# Patient Record
Sex: Female | Born: 1955 | Race: White | Hispanic: No | Smoking: Never smoker
Health system: Southern US, Community
[De-identification: ages and names within clinical notes are randomized; demographics above are authoritative.]

## PROBLEM LIST (undated history)

## (undated) DIAGNOSIS — R112 Nausea with vomiting, unspecified: Secondary | ICD-10-CM

## (undated) DIAGNOSIS — E785 Hyperlipidemia, unspecified: Secondary | ICD-10-CM

## (undated) DIAGNOSIS — Z9889 Other specified postprocedural states: Secondary | ICD-10-CM

## (undated) DIAGNOSIS — I517 Cardiomegaly: Secondary | ICD-10-CM

## (undated) DIAGNOSIS — D62 Acute posthemorrhagic anemia: Secondary | ICD-10-CM

## (undated) DIAGNOSIS — M549 Dorsalgia, unspecified: Secondary | ICD-10-CM

## (undated) DIAGNOSIS — F32A Depression, unspecified: Secondary | ICD-10-CM

## (undated) DIAGNOSIS — D509 Iron deficiency anemia, unspecified: Secondary | ICD-10-CM

## (undated) DIAGNOSIS — E669 Obesity, unspecified: Secondary | ICD-10-CM

## (undated) DIAGNOSIS — M1711 Unilateral primary osteoarthritis, right knee: Secondary | ICD-10-CM

## (undated) DIAGNOSIS — K219 Gastro-esophageal reflux disease without esophagitis: Secondary | ICD-10-CM

## (undated) DIAGNOSIS — M1712 Unilateral primary osteoarthritis, left knee: Secondary | ICD-10-CM

## (undated) DIAGNOSIS — Z969 Presence of functional implant, unspecified: Secondary | ICD-10-CM

## (undated) DIAGNOSIS — T4145XA Adverse effect of unspecified anesthetic, initial encounter: Secondary | ICD-10-CM

## (undated) DIAGNOSIS — D649 Anemia, unspecified: Secondary | ICD-10-CM

## (undated) DIAGNOSIS — Z96652 Presence of left artificial knee joint: Secondary | ICD-10-CM

## (undated) DIAGNOSIS — M1990 Unspecified osteoarthritis, unspecified site: Secondary | ICD-10-CM

## (undated) DIAGNOSIS — L02419 Cutaneous abscess of limb, unspecified: Secondary | ICD-10-CM

## (undated) DIAGNOSIS — I219 Acute myocardial infarction, unspecified: Secondary | ICD-10-CM

## (undated) DIAGNOSIS — F329 Major depressive disorder, single episode, unspecified: Secondary | ICD-10-CM

## (undated) DIAGNOSIS — T7840XA Allergy, unspecified, initial encounter: Secondary | ICD-10-CM

## (undated) DIAGNOSIS — F419 Anxiety disorder, unspecified: Secondary | ICD-10-CM

## (undated) DIAGNOSIS — I10 Essential (primary) hypertension: Secondary | ICD-10-CM

## (undated) DIAGNOSIS — D638 Anemia in other chronic diseases classified elsewhere: Secondary | ICD-10-CM

## (undated) DIAGNOSIS — L03119 Cellulitis of unspecified part of limb: Secondary | ICD-10-CM

## (undated) DIAGNOSIS — G5600 Carpal tunnel syndrome, unspecified upper limb: Secondary | ICD-10-CM

## (undated) DIAGNOSIS — T8859XA Other complications of anesthesia, initial encounter: Secondary | ICD-10-CM

## (undated) DIAGNOSIS — N6320 Unspecified lump in the left breast, unspecified quadrant: Secondary | ICD-10-CM

## (undated) DIAGNOSIS — H269 Unspecified cataract: Secondary | ICD-10-CM

## (undated) HISTORY — DX: Cellulitis of unspecified part of limb: L03.119

## (undated) HISTORY — DX: Gastro-esophageal reflux disease without esophagitis: K21.9

## (undated) HISTORY — DX: Iron deficiency anemia, unspecified: D50.9

## (undated) HISTORY — DX: Cardiomegaly: I51.7

## (undated) HISTORY — PX: COLONOSCOPY: SHX174

## (undated) HISTORY — DX: Depression, unspecified: F32.A

## (undated) HISTORY — PX: BREAST LUMPECTOMY: SHX2

## (undated) HISTORY — DX: Carpal tunnel syndrome, unspecified upper limb: G56.00

## (undated) HISTORY — PX: KNEE SURGERY: SHX244

## (undated) HISTORY — PX: FOOT SURGERY: SHX648

## (undated) HISTORY — DX: Major depressive disorder, single episode, unspecified: F32.9

## (undated) HISTORY — DX: Unspecified lump in the left breast, unspecified quadrant: N63.20

## (undated) HISTORY — DX: Unspecified cataract: H26.9

## (undated) HISTORY — PX: ABDOMINAL HYSTERECTOMY: SHX81

## (undated) HISTORY — DX: Acute myocardial infarction, unspecified: I21.9

## (undated) HISTORY — DX: Allergy, unspecified, initial encounter: T78.40XA

## (undated) HISTORY — PX: SPINE SURGERY: SHX786

## (undated) HISTORY — PX: LASIK: SHX215

## (undated) HISTORY — PX: CATARACT EXTRACTION W/ INTRAOCULAR LENS IMPLANT: SHX1309

## (undated) HISTORY — PX: LAPAROSCOPIC NISSEN FUNDOPLICATION: SHX1932

## (undated) HISTORY — DX: Unspecified osteoarthritis, unspecified site: M19.90

## (undated) HISTORY — DX: Cutaneous abscess of limb, unspecified: L02.419

## (undated) HISTORY — PX: GASTRIC BYPASS: SHX52

## (undated) HISTORY — DX: Obesity, unspecified: E66.9

---

## 1999-03-16 ENCOUNTER — Other Ambulatory Visit: Admission: RE | Admit: 1999-03-16 | Discharge: 1999-03-16 | Payer: Self-pay | Admitting: Obstetrics and Gynecology

## 1999-11-02 ENCOUNTER — Encounter: Payer: Self-pay | Admitting: Internal Medicine

## 1999-11-02 ENCOUNTER — Ambulatory Visit (HOSPITAL_COMMUNITY): Admission: RE | Admit: 1999-11-02 | Discharge: 1999-11-02 | Payer: Self-pay | Admitting: Internal Medicine

## 1999-11-08 ENCOUNTER — Encounter: Payer: Self-pay | Admitting: Internal Medicine

## 1999-11-08 ENCOUNTER — Ambulatory Visit (HOSPITAL_COMMUNITY): Admission: RE | Admit: 1999-11-08 | Discharge: 1999-11-08 | Payer: Self-pay | Admitting: Internal Medicine

## 1999-12-06 ENCOUNTER — Encounter: Payer: Self-pay | Admitting: Surgery

## 1999-12-06 ENCOUNTER — Encounter (INDEPENDENT_AMBULATORY_CARE_PROVIDER_SITE_OTHER): Payer: Self-pay | Admitting: Specialist

## 1999-12-06 ENCOUNTER — Ambulatory Visit (HOSPITAL_BASED_OUTPATIENT_CLINIC_OR_DEPARTMENT_OTHER): Admission: RE | Admit: 1999-12-06 | Discharge: 1999-12-06 | Payer: Self-pay | Admitting: Surgery

## 1999-12-25 ENCOUNTER — Other Ambulatory Visit: Admission: RE | Admit: 1999-12-25 | Discharge: 1999-12-25 | Payer: Self-pay | Admitting: Obstetrics and Gynecology

## 2000-02-05 ENCOUNTER — Ambulatory Visit (HOSPITAL_BASED_OUTPATIENT_CLINIC_OR_DEPARTMENT_OTHER): Admission: RE | Admit: 2000-02-05 | Discharge: 2000-02-05 | Payer: Self-pay | Admitting: Orthopedic Surgery

## 2000-06-13 ENCOUNTER — Other Ambulatory Visit: Admission: RE | Admit: 2000-06-13 | Discharge: 2000-06-13 | Payer: Self-pay | Admitting: Obstetrics and Gynecology

## 2000-08-22 ENCOUNTER — Ambulatory Visit (HOSPITAL_COMMUNITY): Admission: RE | Admit: 2000-08-22 | Discharge: 2000-08-22 | Payer: Self-pay | Admitting: Obstetrics and Gynecology

## 2000-08-22 ENCOUNTER — Encounter (INDEPENDENT_AMBULATORY_CARE_PROVIDER_SITE_OTHER): Payer: Self-pay

## 2001-01-06 ENCOUNTER — Other Ambulatory Visit: Admission: RE | Admit: 2001-01-06 | Discharge: 2001-01-06 | Payer: Self-pay | Admitting: Obstetrics and Gynecology

## 2001-06-17 ENCOUNTER — Other Ambulatory Visit: Admission: RE | Admit: 2001-06-17 | Discharge: 2001-06-17 | Payer: Self-pay | Admitting: Obstetrics and Gynecology

## 2001-10-15 HISTORY — PX: ROTATOR CUFF REPAIR: SHX139

## 2002-03-31 ENCOUNTER — Ambulatory Visit (HOSPITAL_COMMUNITY): Admission: RE | Admit: 2002-03-31 | Discharge: 2002-03-31 | Payer: Self-pay | Admitting: Obstetrics and Gynecology

## 2002-03-31 ENCOUNTER — Encounter: Payer: Self-pay | Admitting: Obstetrics and Gynecology

## 2002-04-06 ENCOUNTER — Observation Stay (HOSPITAL_COMMUNITY): Admission: RE | Admit: 2002-04-06 | Discharge: 2002-04-07 | Payer: Self-pay | Admitting: Obstetrics and Gynecology

## 2002-04-06 ENCOUNTER — Encounter (INDEPENDENT_AMBULATORY_CARE_PROVIDER_SITE_OTHER): Payer: Self-pay

## 2002-04-17 ENCOUNTER — Inpatient Hospital Stay (HOSPITAL_COMMUNITY): Admission: AD | Admit: 2002-04-17 | Discharge: 2002-04-17 | Payer: Self-pay | Admitting: Obstetrics and Gynecology

## 2002-09-24 ENCOUNTER — Emergency Department (HOSPITAL_COMMUNITY): Admission: EM | Admit: 2002-09-24 | Discharge: 2002-09-24 | Payer: Self-pay | Admitting: Emergency Medicine

## 2003-05-10 ENCOUNTER — Ambulatory Visit (HOSPITAL_COMMUNITY): Admission: RE | Admit: 2003-05-10 | Discharge: 2003-05-10 | Payer: Self-pay | Admitting: Obstetrics and Gynecology

## 2003-05-10 ENCOUNTER — Encounter: Payer: Self-pay | Admitting: Obstetrics and Gynecology

## 2003-06-17 ENCOUNTER — Other Ambulatory Visit: Admission: RE | Admit: 2003-06-17 | Discharge: 2003-06-17 | Payer: Self-pay | Admitting: Obstetrics and Gynecology

## 2003-12-01 ENCOUNTER — Emergency Department (HOSPITAL_COMMUNITY): Admission: EM | Admit: 2003-12-01 | Discharge: 2003-12-01 | Payer: Self-pay | Admitting: Emergency Medicine

## 2003-12-04 ENCOUNTER — Encounter: Admission: RE | Admit: 2003-12-04 | Discharge: 2003-12-04 | Payer: Self-pay | Admitting: Family Medicine

## 2003-12-16 ENCOUNTER — Encounter: Admission: RE | Admit: 2003-12-16 | Discharge: 2003-12-16 | Payer: Self-pay | Admitting: Family Medicine

## 2003-12-22 ENCOUNTER — Encounter: Admission: RE | Admit: 2003-12-22 | Discharge: 2003-12-22 | Payer: Self-pay | Admitting: Family Medicine

## 2004-01-24 ENCOUNTER — Ambulatory Visit (HOSPITAL_BASED_OUTPATIENT_CLINIC_OR_DEPARTMENT_OTHER): Admission: RE | Admit: 2004-01-24 | Discharge: 2004-01-24 | Payer: Self-pay | Admitting: Orthopedic Surgery

## 2004-01-24 ENCOUNTER — Ambulatory Visit (HOSPITAL_COMMUNITY): Admission: RE | Admit: 2004-01-24 | Discharge: 2004-01-24 | Payer: Self-pay | Admitting: Orthopedic Surgery

## 2005-01-09 ENCOUNTER — Ambulatory Visit (HOSPITAL_COMMUNITY): Admission: RE | Admit: 2005-01-09 | Discharge: 2005-01-09 | Payer: Self-pay | Admitting: Gastroenterology

## 2005-07-30 IMAGING — CR DG KNEE COMPLETE 4+V*R*
4 series · 4 of 4 positions shown · non-contrast
Comparison: none

CLINICAL DATA: Posterior knee pain.  

 FOUR VIEW RIGHT KNEE
 Four views demonstrate no acute bony abnormality with no effusion.  There is an old fractured screw in the proximal tibia at the level of the tibial tuberosity.  
 IMPRESSION
 No acute abnormality with no effusion. 
 Old fractured tibial tuberosity screw. 
 [REDACTED]

[view not recorded (1 of 4)]
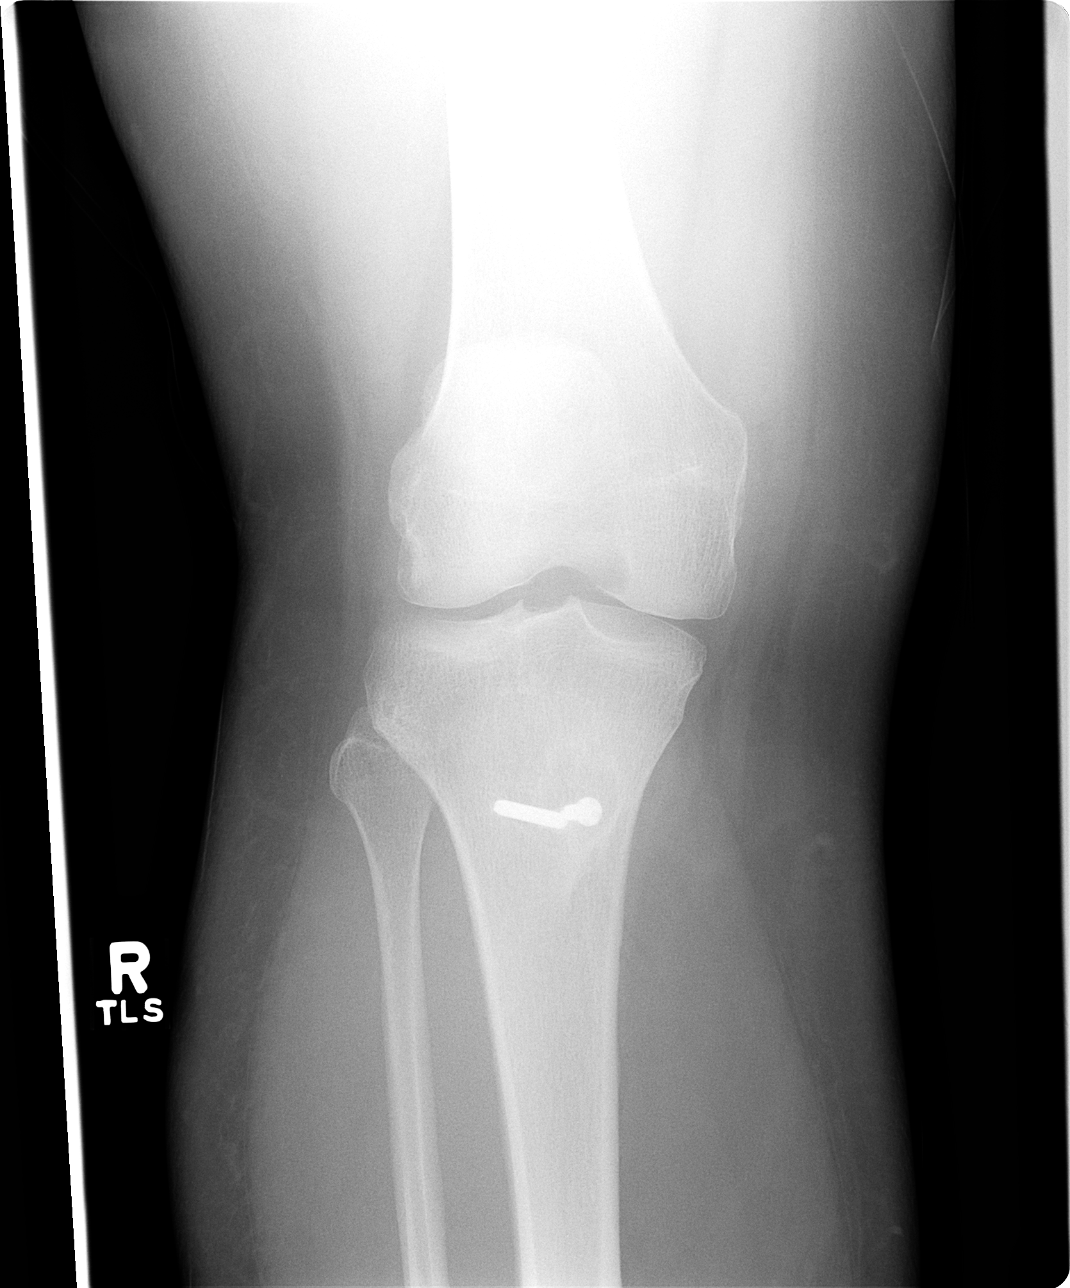

[view not recorded (2 of 4)]
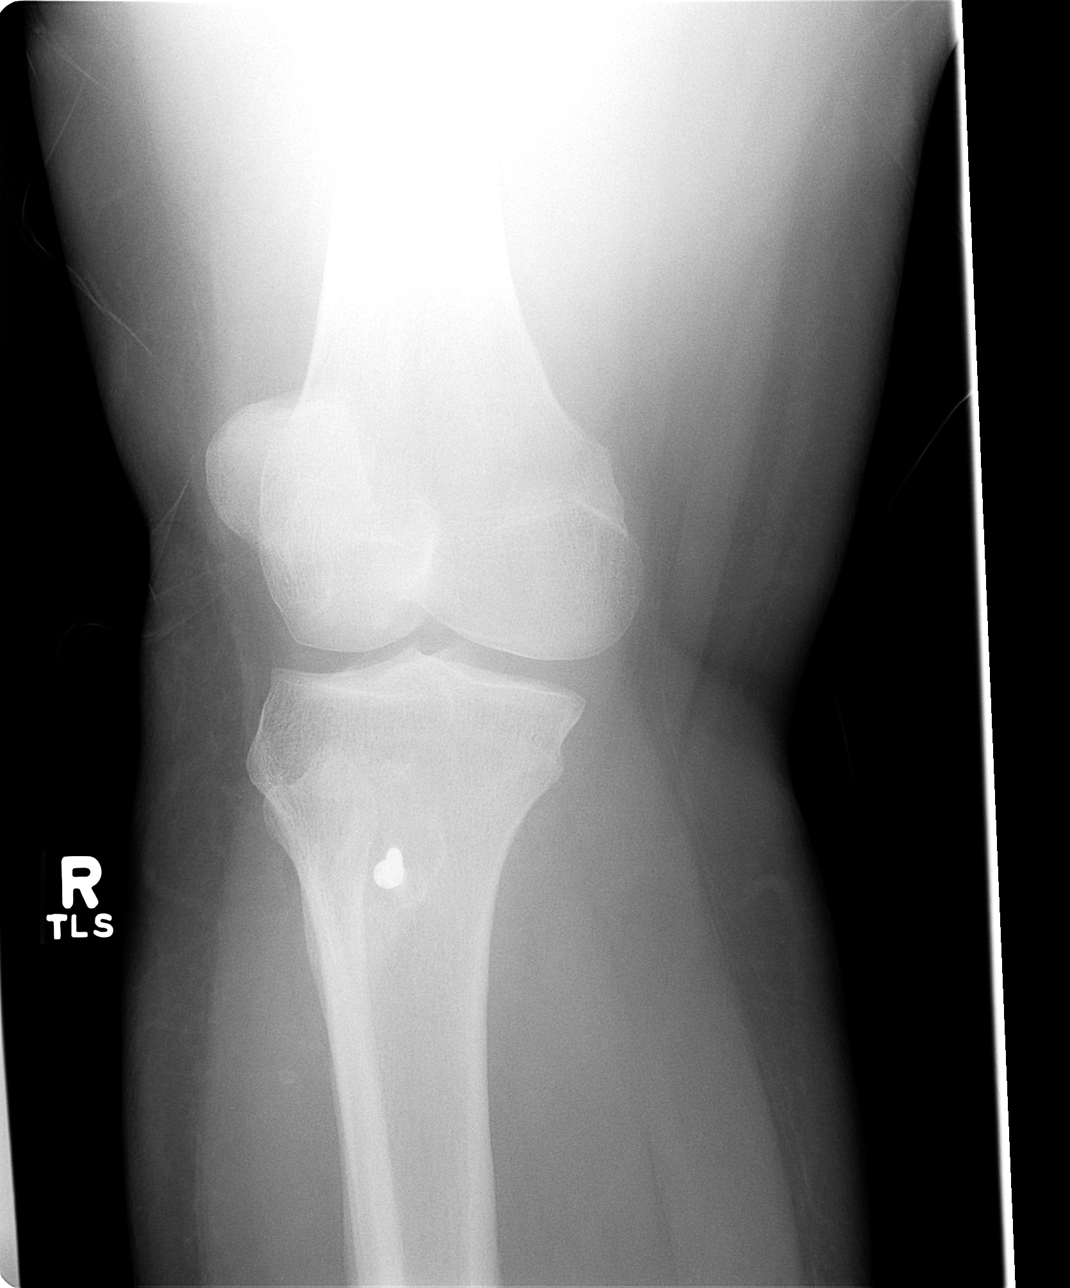

[view not recorded (3 of 4)]
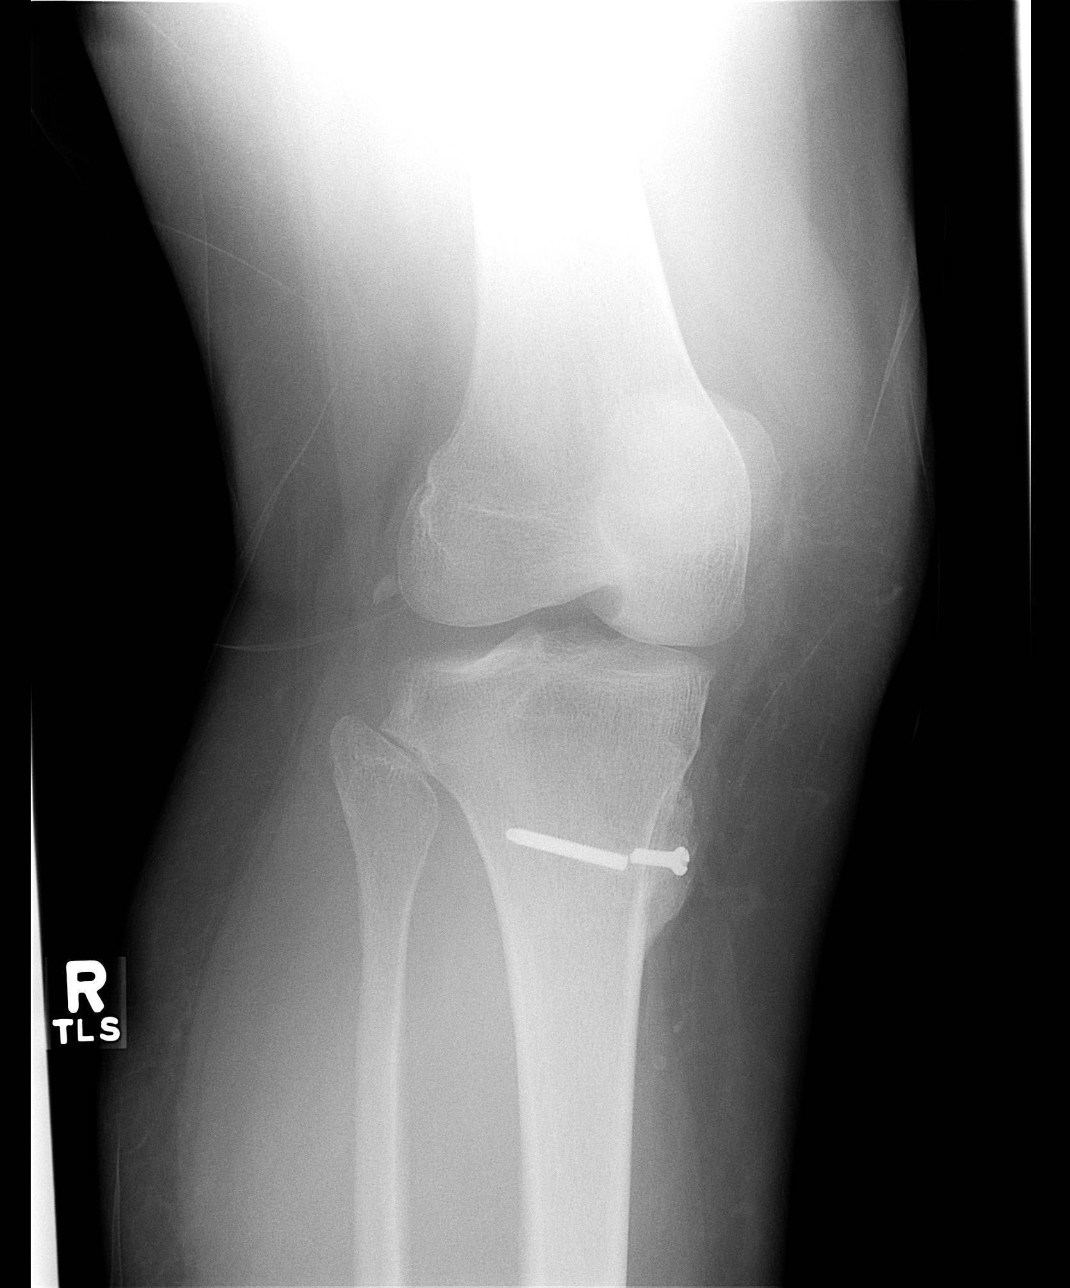

[view not recorded (4 of 4)]
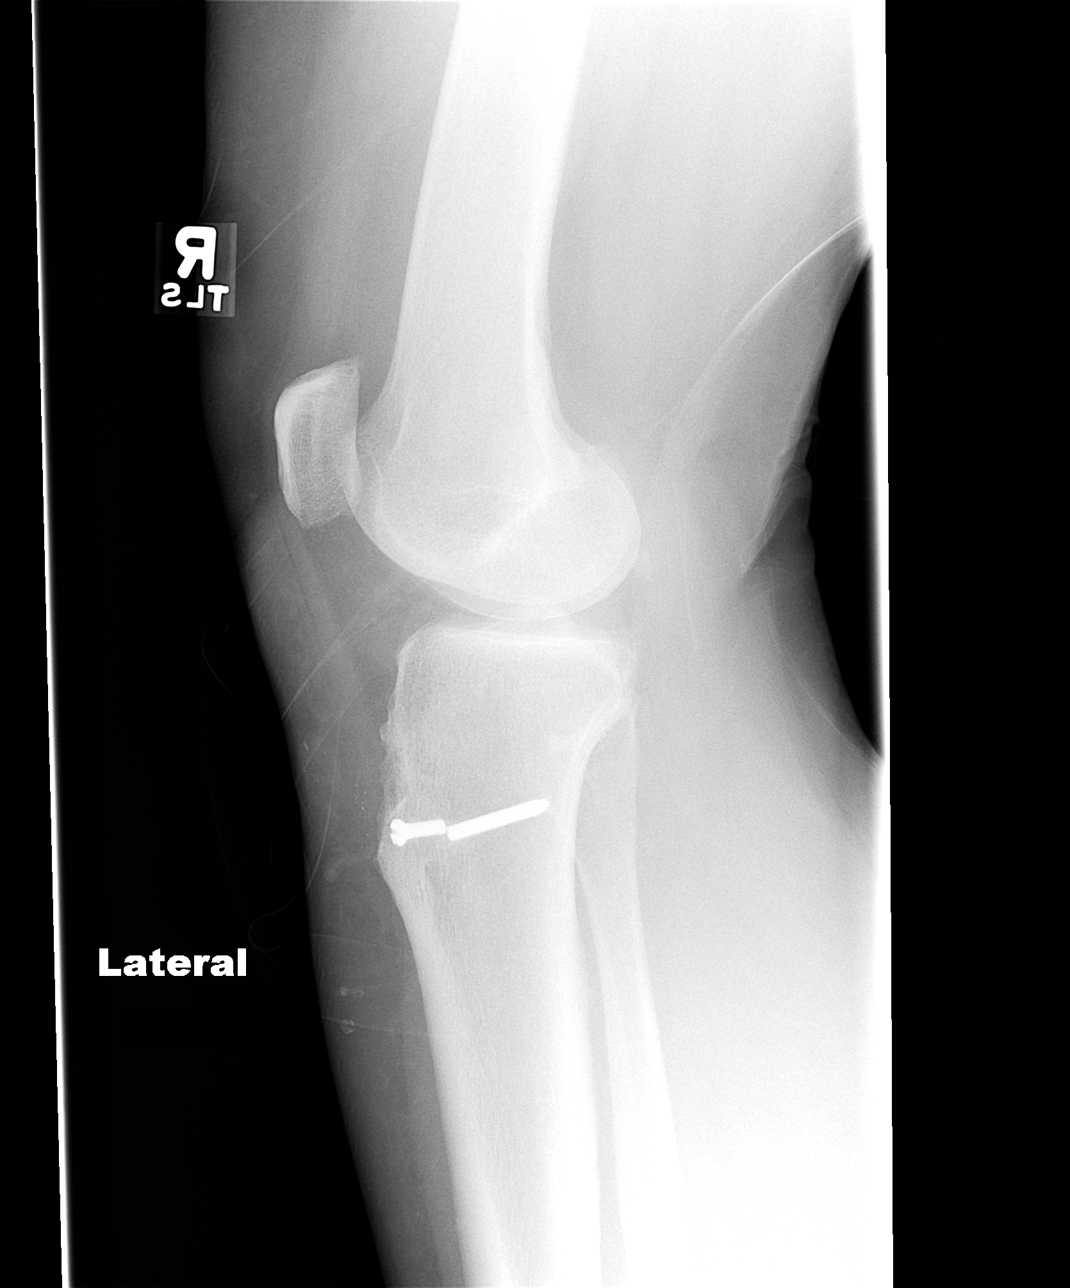

[4 of 4 positions shown; findings below may reference images not displayed]

## 2005-10-30 ENCOUNTER — Ambulatory Visit (HOSPITAL_COMMUNITY): Admission: RE | Admit: 2005-10-30 | Discharge: 2005-10-30 | Payer: Self-pay | Admitting: Surgery

## 2005-12-19 ENCOUNTER — Inpatient Hospital Stay (HOSPITAL_COMMUNITY): Admission: RE | Admit: 2005-12-19 | Discharge: 2005-12-23 | Payer: Self-pay | Admitting: Surgery

## 2006-06-06 ENCOUNTER — Ambulatory Visit (HOSPITAL_COMMUNITY): Admission: RE | Admit: 2006-06-06 | Discharge: 2006-06-06 | Payer: Self-pay | Admitting: Internal Medicine

## 2008-07-06 ENCOUNTER — Ambulatory Visit (HOSPITAL_COMMUNITY): Admission: RE | Admit: 2008-07-06 | Discharge: 2008-07-06 | Payer: Self-pay | Admitting: Surgery

## 2008-07-07 ENCOUNTER — Ambulatory Visit (HOSPITAL_COMMUNITY): Admission: RE | Admit: 2008-07-07 | Discharge: 2008-07-07 | Payer: Self-pay | Admitting: Surgery

## 2008-07-08 ENCOUNTER — Encounter: Admission: RE | Admit: 2008-07-08 | Discharge: 2008-07-08 | Payer: Self-pay | Admitting: Surgery

## 2008-12-23 ENCOUNTER — Encounter: Admission: RE | Admit: 2008-12-23 | Discharge: 2009-03-23 | Payer: Self-pay | Admitting: Surgery

## 2009-01-11 ENCOUNTER — Inpatient Hospital Stay (HOSPITAL_COMMUNITY): Admission: RE | Admit: 2009-01-11 | Discharge: 2009-01-14 | Payer: Self-pay | Admitting: Surgery

## 2009-01-12 ENCOUNTER — Ambulatory Visit: Payer: Self-pay | Admitting: Vascular Surgery

## 2009-01-12 ENCOUNTER — Encounter (INDEPENDENT_AMBULATORY_CARE_PROVIDER_SITE_OTHER): Payer: Self-pay | Admitting: Surgery

## 2009-04-04 ENCOUNTER — Encounter: Admission: RE | Admit: 2009-04-04 | Discharge: 2009-04-04 | Payer: Self-pay | Admitting: Surgery

## 2009-07-04 ENCOUNTER — Encounter: Admission: RE | Admit: 2009-07-04 | Discharge: 2009-07-04 | Payer: Self-pay | Admitting: Surgery

## 2010-11-06 ENCOUNTER — Encounter: Payer: Self-pay | Admitting: Surgery

## 2010-11-15 DIAGNOSIS — L02419 Cutaneous abscess of limb, unspecified: Secondary | ICD-10-CM

## 2010-11-15 HISTORY — DX: Cutaneous abscess of limb, unspecified: L02.419

## 2010-11-20 ENCOUNTER — Emergency Department (HOSPITAL_COMMUNITY): Payer: BC Managed Care – PPO

## 2010-11-20 ENCOUNTER — Inpatient Hospital Stay (HOSPITAL_COMMUNITY)
Admission: EM | Admit: 2010-11-20 | Discharge: 2010-11-26 | DRG: 277 | Disposition: A | Discharge status: Home Health | Payer: BC Managed Care – PPO | Attending: Internal Medicine | Admitting: Internal Medicine

## 2010-11-20 DIAGNOSIS — I10 Essential (primary) hypertension: Secondary | ICD-10-CM | POA: Diagnosis present

## 2010-11-20 DIAGNOSIS — F3289 Other specified depressive episodes: Secondary | ICD-10-CM | POA: Diagnosis present

## 2010-11-20 DIAGNOSIS — B950 Streptococcus, group A, as the cause of diseases classified elsewhere: Secondary | ICD-10-CM | POA: Diagnosis present

## 2010-11-20 DIAGNOSIS — L02419 Cutaneous abscess of limb, unspecified: Principal | ICD-10-CM | POA: Diagnosis present

## 2010-11-20 DIAGNOSIS — L03119 Cellulitis of unspecified part of limb: Principal | ICD-10-CM | POA: Diagnosis present

## 2010-11-20 DIAGNOSIS — D509 Iron deficiency anemia, unspecified: Secondary | ICD-10-CM | POA: Diagnosis present

## 2010-11-20 DIAGNOSIS — E669 Obesity, unspecified: Secondary | ICD-10-CM | POA: Diagnosis present

## 2010-11-20 DIAGNOSIS — I872 Venous insufficiency (chronic) (peripheral): Secondary | ICD-10-CM | POA: Diagnosis present

## 2010-11-20 DIAGNOSIS — E876 Hypokalemia: Secondary | ICD-10-CM | POA: Diagnosis present

## 2010-11-20 DIAGNOSIS — Z9884 Bariatric surgery status: Secondary | ICD-10-CM

## 2010-11-20 DIAGNOSIS — F988 Other specified behavioral and emotional disorders with onset usually occurring in childhood and adolescence: Secondary | ICD-10-CM | POA: Diagnosis present

## 2010-11-20 DIAGNOSIS — F329 Major depressive disorder, single episode, unspecified: Secondary | ICD-10-CM | POA: Diagnosis present

## 2010-11-20 DIAGNOSIS — K219 Gastro-esophageal reflux disease without esophagitis: Secondary | ICD-10-CM | POA: Diagnosis present

## 2010-11-20 LAB — PROTIME-INR
INR: 1.24 (ref 0.00–1.49)
Prothrombin Time: 15.8 seconds — ABNORMAL HIGH (ref 11.6–15.2)

## 2010-11-20 LAB — COMPREHENSIVE METABOLIC PANEL
Albumin: 3.3 g/dL — ABNORMAL LOW (ref 3.5–5.2)
Alkaline Phosphatase: 55 U/L (ref 39–117)
BUN: 11 mg/dL (ref 6–23)
Calcium: 8.2 mg/dL — ABNORMAL LOW (ref 8.4–10.5)
Creatinine, Ser: 0.84 mg/dL (ref 0.4–1.2)
Glucose, Bld: 97 mg/dL (ref 70–99)
Total Protein: 7 g/dL (ref 6.0–8.3)

## 2010-11-20 LAB — LACTIC ACID, PLASMA: Lactic Acid, Venous: 1.4 mmol/L (ref 0.5–2.2)

## 2010-11-20 LAB — CBC
MCHC: 34.4 g/dL (ref 30.0–36.0)
Platelets: 236 10*3/uL (ref 150–400)
RDW: 14.1 % (ref 11.5–15.5)
WBC: 12.7 10*3/uL — ABNORMAL HIGH (ref 4.0–10.5)

## 2010-11-20 LAB — DIFFERENTIAL
Basophils Absolute: 0 10*3/uL (ref 0.0–0.1)
Basophils Relative: 0 % (ref 0–1)
Eosinophils Relative: 0 % (ref 0–5)
Lymphocytes Relative: 8 % — ABNORMAL LOW (ref 12–46)
Monocytes Absolute: 1.1 10*3/uL — ABNORMAL HIGH (ref 0.1–1.0)

## 2010-11-21 LAB — TSH: TSH: 1.398 u[IU]/mL (ref 0.350–4.500)

## 2010-11-21 LAB — URINE MICROSCOPIC-ADD ON

## 2010-11-21 LAB — URINALYSIS, ROUTINE W REFLEX MICROSCOPIC
Protein, ur: NEGATIVE mg/dL
Specific Gravity, Urine: 1.018 (ref 1.005–1.030)
Urine Glucose, Fasting: NEGATIVE mg/dL

## 2010-11-22 LAB — BASIC METABOLIC PANEL
CO2: 26 mEq/L (ref 19–32)
Calcium: 8.1 mg/dL — ABNORMAL LOW (ref 8.4–10.5)
Creatinine, Ser: 0.74 mg/dL (ref 0.4–1.2)
Glucose, Bld: 108 mg/dL — ABNORMAL HIGH (ref 70–99)

## 2010-11-22 LAB — CBC
HCT: 27.5 % — ABNORMAL LOW (ref 36.0–46.0)
Hemoglobin: 9.3 g/dL — ABNORMAL LOW (ref 12.0–15.0)
MCH: 29.2 pg (ref 26.0–34.0)
MCHC: 33.8 g/dL (ref 30.0–36.0)

## 2010-11-23 ENCOUNTER — Inpatient Hospital Stay (HOSPITAL_COMMUNITY): Payer: BC Managed Care – PPO

## 2010-11-23 DIAGNOSIS — L0291 Cutaneous abscess, unspecified: Secondary | ICD-10-CM

## 2010-11-23 DIAGNOSIS — L0390 Cellulitis, unspecified: Secondary | ICD-10-CM

## 2010-11-23 LAB — BASIC METABOLIC PANEL
BUN: 4 mg/dL — ABNORMAL LOW (ref 6–23)
Calcium: 8.3 mg/dL — ABNORMAL LOW (ref 8.4–10.5)
GFR calc non Af Amer: >60 mL/min (ref >60)
Glucose, Bld: 109 mg/dL — ABNORMAL HIGH (ref 70–99)
Sodium: 142 mEq/L (ref 135–145)

## 2010-11-23 LAB — CBC
MCH: 28.6 pg (ref 26.0–34.0)
MCHC: 33.1 g/dL (ref 30.0–36.0)
Platelets: 189 10*3/uL (ref 150–400)
RDW: 14.1 % (ref 11.5–15.5)

## 2010-11-23 MED ORDER — GADOXETATE DISODIUM 0.25 MMOL/ML IV SOLN: 20.0000 mL | Freq: Once | INTRAVENOUS | Status: AC | PRN

## 2010-11-24 LAB — BASIC METABOLIC PANEL
Calcium: 8.5 mg/dL (ref 8.4–10.5)
Creatinine, Ser: 0.75 mg/dL (ref 0.4–1.2)
GFR calc Af Amer: >60 mL/min (ref >60)

## 2010-11-24 LAB — CBC
MCH: 28.6 pg (ref 26.0–34.0)
MCHC: 33.1 g/dL (ref 30.0–36.0)
Platelets: 193 10*3/uL (ref 150–400)
RDW: 14.2 % (ref 11.5–15.5)

## 2010-11-24 LAB — IRON AND TIBC
Iron: 25 ug/dL — ABNORMAL LOW (ref 42–135)
TIBC: 226 ug/dL — ABNORMAL LOW (ref 250–470)

## 2010-11-26 LAB — CULTURE, BLOOD (ROUTINE X 2)
Culture  Setup Time: 201202062253
Culture  Setup Time: 201202062253
Culture: NO GROWTH

## 2010-11-26 LAB — CBC
MCH: 28.6 pg (ref 26.0–34.0)
MCV: 86.8 fL (ref 78.0–100.0)
Platelets: 269 10*3/uL (ref 150–400)
RDW: 14.2 % (ref 11.5–15.5)

## 2010-11-26 LAB — BASIC METABOLIC PANEL
BUN: 5 mg/dL — ABNORMAL LOW (ref 6–23)
Chloride: 108 mEq/L (ref 96–112)
Creatinine, Ser: 0.75 mg/dL (ref 0.4–1.2)
GFR calc non Af Amer: >60 mL/min (ref >60)
Glucose, Bld: 99 mg/dL (ref 70–99)

## 2010-11-27 NOTE — H&P (Signed)
Nicole [name], [name]              ACCOUNT NO.:  [id number]  MEDICAL RECORD NO.:  [id number]           PATIENT TYPE:  E  LOCATION:  WLED                         FACILITY:  [facility]  PHYSICIAN:  Geoffry Paradise, M.D.  DATE OF BIRTH:  [birth year]  DATE OF ADMISSION:  11/20/2010 DATE OF DISCHARGE:                             HISTORY & PHYSICAL   CHIEF COMPLAINT:  Fever and leg rash.  HISTORY OF PRESENT ILLNESS:  Nicole [name] is a 55 year old with past medical history including recent bariatric surgery, gastroesophageal reflux disease, hypertension presenting at this time with 24-48 hours of febrile illness and rash involving her right lower extremity.  She is normally very active and on her feet through the week, teaching full time.  Sometimes Saturday, she began feeling feverish and noticed some malaise and weakness.  As of yesterday, she noted a rash appearing on her right lower extremity and ultimately contacted the office requesting Tamiflu this morning because of "flu."  Ultimately, she presented to urgent medical with persistent fevers and development of nausea, vomiting and some loose stools but was ultimately sent to the emergency room when she was noted to have a systolic pressure in the 90s and what appeared be a fairly extensive right lower extremity cellulitis.  At this point, she does relate vomiting and pain involving this right lower extremity and the spreading rash.  She has felt feverish, does relate some nausea and intermittent vomiting with some loose stool.  She is having no cough or congestion.  She denies any abdominal pain.  She denies any urinary symptoms.  She is having no headaches, photophobia dizziness or lightheadedness.  She is admitted at this time for IV fluids and IV antibiotics.  At this point, she has already received one dose of Zosyn.  PAST MEDICAL HISTORY:  The patient is INTOLERANT TO CODEINE and CONTRAST.  MEDICATIONS: 1. Citalopram 40 mg  daily. 2. Flonase 2 sprays each nostril daily. 3. Wellbutrin XL 150 one daily. 4. Zyrtec 10 mg p.o. daily as needed. 5. Benicar 40 mg daily. 6. Multivitamin one daily. 7. B12 tablets one every other day. 8. Calcium 600 mg 2 daily. 9. Mobic 15 mg once daily.  MEDICAL ILLNESS: 1. Gastroesophageal reflux disease. 2. Venous insufficiency. 3. Hyperlipidemia. 4. Hypertension. 5. Chronic obesity. 6. Osteoarthritis. 7. Carpal tunnel syndrome.  PAST SURGICAL ILLNESSES: 1. Multiple back surgeries, 1987, 1989. 2. Multiple knee surgeries, right 1979, left 1998, 1999. 3. Right shoulder reconstruction, April 2001. 4. Total abdominal hysterectomy, June 2003. 5. Reflux surgery by Dr. Daphine Deutscher in 2007. 6. Recent gastric bypass surgery, March 2010 and excision of a benign     left breast mass.  SOCIAL HISTORY:  The patient is divorced, has 2 children.  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  Does not smoke or drink.  FAMILY HISTORY:  Remarkable for coronary disease, hyperlipidemia, hypertension. PHYSICAL EXAM:  VITAL SIGNS:  Temperature is 99, blood pressure 119/77, pulse 80, respiratory rate is 20, O2 saturation is 97%. GENERAL:  The patient is awake, alert, supine, appears ill. HEENT:  Good facial symmetry.  Anicteric.  Oropharynx  benign. NECK:  No JVD or bruits.  Supple, nontender.  Carotids 2+/4+. LUNGS:  Clear to auscultation and percussion. CARDIOVASCULAR:  Regular rate and rhythm.  No murmur, gallop, rub or heave. ABDOMEN:  Soft, nontender.  Bowel sounds normal.  No rebound or guarding. BACK:  Without CVA or spinal tenderness. EXTREMITIES:  Revealed intact distal pulses.  Joints appear without effusions.  Left lower extremity is normal.  Right lower extremity is trace to 1+ edema with some venous changes.  She does have a fairly significant spreading erythema, warmth and tenderness involving the pretibial and calf regions compatible with a  cellulitis.  Denna Haggard' sign is negative.  Pulses are intact. NEUROLOGICALLY:  She is nonlateralizing.  She is awake, alert, higher cortical functioning is intact.  She is mentating, moves extremities x4.  LABORATORY DATA:  CBC, hemoglobin is 11.2, hematocrit 32.6, white blood cell count is 12.7, platelet count is 236,000.  Lactate is 1.4.  PT 1.24.  Chemistries are pending at this time.  Blood cultures have been done as well.  ASSESSMENT: 1. Right lower extremity cellulitis with evidence of toxicity.  No     evidence of vascular compromise or deep venous thrombosis. 2. Gastroesophageal reflux disease. 3. Essential hypertension. 4. History of morbid obesity following gastric bypass surgery,     successful in 2010.  PLAN:  The patient will be admitted.  She has been pancultured.  She will be hydrated, treat empirically with antibiotics.  Her Benicar will be held.  Given marginal systolic pressures at this time pending her response to treatment.  For further see orders.          ______________________________ Geoffry Paradise, M.D.     RA/MEDQ  D:  11/20/2010  T:  11/20/2010  Job:  [id number]  Electronically Signed by Geoffry Paradise M.D. on 11/27/2010 09:21:45 PM

## 2010-12-18 NOTE — Discharge Summary (Signed)
Nicole [name], Nicole [name]              ACCOUNT NO.:  [id number]  MEDICAL RECORD NO.:  [id number]           PATIENT TYPE:  I  LOCATION:  1406                         FACILITY:  [facility]  PHYSICIAN:  Gwen Pounds, MD       DATE OF BIRTH:  Dec 10, 1955  DATE OF ADMISSION:  11/20/2010 DATE OF DISCHARGE:                              DISCHARGE SUMMARY   DISCHARGE DIAGNOSES:  Include: 1. Right lower extremity cellulitis, presumed streptococcal     cellulitis. 2. Significant iron-deficiency anemia. 3. Hypokalemia. 4. Hypertension, not needing Benicar currently. 5. Adult attention-deficit disorder. 6. Obesity. 7. Seasonal allergic rhinitis. 8. Degenerative joint disease. 9. Depression. 10.Venous insufficiency. 11.Gastroesophageal reflux disease. 12.Mixed hyperlipidemia. 13.Cardiomegaly on x-ray with history of normal echocardiogram. 14.Carpal tunnel syndrome. 15.History of benign fibroadenoma 16.History of back surgeries 17.History of knee surgeries 18.Right shoulder reconstruction 19.Total abdominal hysterectomy. 20.Reflux with question hiatal hernia surgery per Dr. Daphine Deutscher in 2007. 21.Bariatric surgery, March 2010.  DISCHARGE MEDICATIONS:  List includes: 1. Citalopram 40 mg p.o. daily. 2. Wellbutrin XL 150 mg p.o. daily. 3. Benicar 40 mg, on hold until blood pressure elevates. 4. Meloxicam 15 mg p.o. daily. 5. Tylenol 325 mg 1-2 tablets every 6 hours as needed for pain or     fever. 6. Flonase 2 sprays each nostril daily. 7. Zyrtec 10 mg p.o. daily as needed. 8. Multivitamin 1 p.o. daily. 9. B12 tablets 1 every other day. 10.Calcium 600 mg 2 tablets daily. 11.Keflex 500 mg p.o. q.i.d., #20 written.  DISCHARGE PROCEDURES:  Include: 1. IV iron infusion which caused significant pain. 2. MRI of the lower extremity revealing superficial cellulitis of the     right lower leg.  No myositis, osteomyelitis or abscess noted. 3. X-ray shows low lung volumes, otherwise negative. 4. Medical  management. 5. Consultation with Infectious Disease.  HISTORY OF PRESENT ILLNESS:  Briefly, Ms. Nicole [name] is a 55 year old female with obesity and venous insufficiency who presented to medical attention on November 20, 2010, with 1 to 2 days of febrile illness and progressive rash in the right lower extremity.  Other symptoms included malaise and weakness.  She thought she had a flu.  She was seen and admitted on November 20, 2010, and immediately placed on IV fluids and IV antibiotics including Zosyn for a right lower extremity cellulitis.  The leg was red, hot, swollen and blistering.  White count was 12.7.  HOSPITAL COURSE:  Ms. Nicole [name] was admitted with a right lower extremity cellulitis.  No evidence of toxicity.  The blisters were only forming in the area of the right lower extremity.  She was pancultured. All cultures remained negative.  Infectious Disease was brought on board and felt that this possibly could be a group A beta-hemolytic strep. They recommended placing continuing on vancomycin and changing Zosyn to clindamycin which was done.  MRI was performed and did not reveal a deeper infection.  She finished 5 days of vancomycin and clindamycin and the IV medications were changed over to oral Keflex.  She improved nicely on the antibiotics.  She is currently afebrile.  Her white count is back to normal.  She is up walking.  She is eating.  She is tolerating medical treatment and she will be ready for medical discharge on November 26, 2010, to finish 5 more days of oral antibiotics.  She will follow up with Dr. Felipa Eth in the office later this week.  She has minimal redness and blistering and looks much better and no current complaints.  All her vital signs are stable at this current time and with the right lower extremity cellulitis, again discharged on Keflex with white count of 8.5.  Other issue throughout the hospital stay was anemia.  She had iron deficiency anemia.   She did get iron.  It caused significant neck, throat, back pain.  No hives or rash.  Her hemoglobin got as low as 8.9 and on date of discharge it was 9.1.  Her hemoglobin and other lab parameters resolved throughout as an outpatient.  She did have some hypokalemia which was repleted and she is doing markedly better with that at this time.  Her Benicar is currently on hold as her blood pressures is on the low end and this can be restarted as an outpatient.  On date of discharge, sodium 142, potassium 3.8, chloride 108, bicarb 26, BUN 5, creatinine 0.75, glucose 99.  White count 8.5, hemoglobin 9.1 and platelet count 269.  All of her iron profile parameters are low with a total iron of 25, total iron binding capacity 226 and percent sat of only 11.  All blood cultures remained negative.  TSH was fine and lactic acid was 1.4 on admission.  She will get her vancomycin today and will go home in stable condition.     Gwen Pounds, MD     JMR/MEDQ  D:  11/26/2010  T:  11/26/2010  Job:  [id number]  Electronically Signed by Creola Corn MD on 12/18/2010 09:10:49 PM

## 2011-01-24 LAB — DIFFERENTIAL
Basophils Absolute: 0 10*3/uL (ref 0.0–0.1)
Basophils Relative: 0 % (ref 0–1)
Eosinophils Absolute: 0.1 10*3/uL (ref 0.0–0.7)
Lymphs Abs: 2 10*3/uL (ref 0.7–4.0)
Neutrophils Relative %: 66 % (ref 43–77)

## 2011-01-24 LAB — CBC
MCV: 87.3 fL (ref 78.0–100.0)
MCV: 88.1 fL (ref 78.0–100.0)
Platelets: 136 10*3/uL — ABNORMAL LOW (ref 150–400)
RBC: 3.72 MIL/uL — ABNORMAL LOW (ref 3.87–5.11)
RDW: 14.3 % (ref 11.5–15.5)
WBC: 7.8 10*3/uL (ref 4.0–10.5)
WBC: 8.5 10*3/uL (ref 4.0–10.5)

## 2011-01-25 LAB — CBC
HCT: 38.1 % (ref 36.0–46.0)
Hemoglobin: 12.8 g/dL (ref 12.0–15.0)
MCHC: 33.2 g/dL (ref 30.0–36.0)
MCV: 87 fL (ref 78.0–100.0)
MCV: 88.5 fL (ref 78.0–100.0)
Platelets: 172 10*3/uL (ref 150–400)
RBC: 3.69 MIL/uL — ABNORMAL LOW (ref 3.87–5.11)
RBC: 4.39 MIL/uL (ref 3.87–5.11)
WBC: 6.3 10*3/uL (ref 4.0–10.5)
WBC: 9.1 10*3/uL (ref 4.0–10.5)

## 2011-01-25 LAB — DIFFERENTIAL
Basophils Absolute: 0.1 10*3/uL (ref 0.0–0.1)
Basophils Relative: 0 % (ref 0–1)
Basophils Relative: 1 % (ref 0–1)
Eosinophils Absolute: 0 10*3/uL (ref 0.0–0.7)
Lymphocytes Relative: 34 % (ref 12–46)
Lymphs Abs: 0.8 10*3/uL (ref 0.7–4.0)
Monocytes Relative: 8 % (ref 3–12)
Neutro Abs: 3.4 10*3/uL (ref 1.7–7.7)
Neutro Abs: 7.6 10*3/uL (ref 1.7–7.7)
Neutrophils Relative %: 84 % — ABNORMAL HIGH (ref 43–77)

## 2011-01-25 LAB — COMPREHENSIVE METABOLIC PANEL
Alkaline Phosphatase: 57 U/L (ref 39–117)
BUN: 19 mg/dL (ref 6–23)
CO2: 27 mEq/L (ref 19–32)
Chloride: 105 mEq/L (ref 96–112)
Creatinine, Ser: 0.91 mg/dL (ref 0.4–1.2)
GFR calc non Af Amer: >60 mL/min (ref >60)
Glucose, Bld: 114 mg/dL — ABNORMAL HIGH (ref 70–99)
Potassium: 3.9 mEq/L (ref 3.5–5.1)
Total Bilirubin: 1.2 mg/dL (ref 0.3–1.2)

## 2011-01-25 LAB — HEMOGLOBIN AND HEMATOCRIT, BLOOD: Hemoglobin: 12.6 g/dL (ref 12.0–15.0)

## 2011-02-27 NOTE — Discharge Summary (Signed)
[name], [name]              ACCOUNT NO.:  [id number]   MEDICAL RECORD NO.:  [id number]          PATIENT TYPE:  INP   LOCATION:  1530                         FACILITY:  [facility]   PHYSICIAN:  Thornton Park. Daphine Deutscher, MD  DATE OF BIRTH:  Sep 29, 1956   DATE OF ADMISSION:  01/11/2009  DATE OF DISCHARGE:  01/14/2009                               DISCHARGE SUMMARY   ADMITTING DIAGNOSIS:  Previous Nissen fundoplication, functioning,  morbid obesity.   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   COURSE IN HOSPITAL:  Ms. Nicole [name] came in a.m. admission and underwent  takedown of her Nissen fundoplication and conversion to an  antecolic/antegastric candy-cane left 1-meter Roux limb Roux-en-Y  gastric bypass.  Postoperatively she had a swallow which showed a small  pouch and good emptying, and she was started on liquids for the  subsequent two days which she tolerated very well.  She was ready for  discharge on 01/14/2009.   CONDITION:  Good.   MEDICATIONS:  Medications include Roxicet elixir for pain.      Thornton Park Daphine Deutscher, MD  Electronically Signed     MBM/MEDQ  D:  01/14/2009  T:  01/14/2009  Job:  [id number]

## 2011-02-27 NOTE — Op Note (Signed)
Nicole [name], Nicole [name]              ACCOUNT NO.:  [id number]   MEDICAL RECORD NO.:  [id number]          PATIENT TYPE:  INP   LOCATION:  0003                         FACILITY:  [facility]   PHYSICIAN:  Thornton Park. Daphine Deutscher, MD  DATE OF BIRTH:  1956/01/11   DATE OF PROCEDURE:  01/11/2009  DATE OF DISCHARGE:                               OPERATIVE REPORT   PREOPERATIVE DIAGNOSES:  Morbid obesity, body mass index of 44, with  previous laparoscopic Nissen fundoplication.   POSTOPERATIVE DIAGNOSES:  Previous Nissen fundoplication 3 years ago by  me, now with laparoscopic takedown of Nissen fundoplication and creation  of laparoscopic Roux-en-Y gastric bypass (100-cm Roux limb, 40-cm  biliopancreatic limb, antecolic, antegastric, candy cane left, closure  of Peterson's defect.   SURGEON:  Thornton Park. Daphine Deutscher, M.D.   ASSISTANT:  Sandria Bales. Ezzard Standing, M.D. (Dr. Ezzard Standing endoscoped her x2).   DESCRIPTION OF PROCEDURE:  The 55 year old white female was taken to  room-1 on Tuesday, January 11, 2009, and given general anesthesia.  The  abdomen was prepped with Techni-Care equivalent and draped sterilely.  Access to the abdomen was gained through the left upper quadrant using  her old lap Nissen incision and using an Optiview without difficulty.  The abdomen was insufflated and standard trocar placement was used.  First, I placed the Nathanson retractor and retracted the liver and  began the foregut dissection.  We had a 5 and an 11 on the left side  laterally, another one below for the camera on the left, and then two on  the right, and then one in the upper midline.  I began the dissection  again using a Harmonic to take down adhesions to the liver and  identifying the wrap and freeing it.  A bulk of the dissection was  taking it down from the right crus, but we were eventually able to free  the stomach from the repair where there was a pledget.  The 2-suture  wrap was taken down and the limbs brought around,  and it was reduced as  much as I could, although it was fused pretty much posteriorly.  Since I  had plenty of fundus now to work with, I elected to go ahead and began  creating the pouch.   We measured about 6 cm along the lesser curve and divided the stomach  with a series of applications of the AutoSuture stapler, the last of  which up near the top of the fundus I used the [employer]  reinforcement.   Dr. Ezzard Standing endoscoped the patient and submerged the pouch.  No bubbles  were seen, nice pouch, approximately 6-7 cm length on the lesser curve.   We then went below and identified the ligament of Treitz and measured 40  cm, divided the bowel, and then measured a full meter along and then  created a side-to-side jejunojejunostomy with a 6-cm white cartridge  AutoSuture.  The defect was closed from either end with a 2-0 Vicryl.  The mesenteric defect was closed with running 2-0 silk and then Tisseel  was applied on that anastomosis.   We then  divided the omentum and brought the limbs up and then sewed a  backwall along the staple line.  Common defects were created in the  stomach and the Roux limb, again with candy cane facing left.  We came  in from the right side, firing the stapler.  Nice __________ was  present.  The common defect was closed in 2 layers with 2-0 Vicryl.  I  reinforced in a couple of places since there was a little bit of a  gusset on the jejunal end of this closure.  A second layer using a free  stitch of 2-0 Vicryl was used to reinforce this.   I then closed Peterson's defect, suturing the mesentery of the Roux down  to the mesentery of the transverse colon with a figure-of-eight suture  of 2-0 silk.  I then clamped off, Dr. Ezzard Standing scoped again, revealing a  nice pouch, and he could go through the anastomosis and look down the  small bowel.  This was deflated.  I also submerged it under saline.  No  evidence of any bubbles or a leak.  Mucosa looked healthy.  No  evidence  of bleeding or ischemia.   Everything looked to be in order.  We  evacuated a lot of the irrigant  that we had, although some was left in.  The abdomen was deflated.  Wounds were closed with 4-0 Vicryl, Benzoin and Steri-Strips.  The  patient tolerated the procedure well and was taken to the recovery room  in satisfactory condition.  I did inject the wounds with 0.5% Marcaine.      Thornton Park Daphine Deutscher, MD  Electronically Signed     MBM/MEDQ  D:  01/11/2009  T:  01/11/2009  Job:  [id number]   cc:   Larina Earthly, M.D.  Fax: [phone]

## 2011-02-27 NOTE — Op Note (Signed)
Nicole [name], Nicole [name]              ACCOUNT NO.:  [id number]   MEDICAL RECORD NO.:  [id number]          PATIENT TYPE:  INP   LOCATION:  NA                           FACILITY:  [facility]   PHYSICIAN:  Sandria Bales. Ezzard Standing, M.D.  DATE OF BIRTH:  1956/04/23   DATE OF PROCEDURE:  01/11/2009  DATE OF DISCHARGE:                               OPERATIVE REPORT   Date of Surgery - 11 January 2009   PREOPERATIVE DIAGNOSIS:  Morbid obesity, status post Roux-en-Y gastric  bypass.   POSTOPERATIVE DIAGNOSIS:  Morbid obesity, status post Roux-en-Y gastric  bypass.   PROCEDURE:  Esophagogastroscopy.   SURGEON:  Sandria Bales. Ezzard Standing, M.D., no first assistant.   ANESTHESIA:  General endotracheal.   BLOOD LOSS:  None.   INDICATIONS FOR PROCEDURE:  Ms. Nicole [name] is a 55 year old white female  who is undergoing a laparoscopic Roux-en-Y gastric bypass by Dr. Wenda Low I and doing endoscopy to both confirm the size of pouch and the  anastomosis.   OPERATIVE NOTE:  I actually did 2 endoscopies.  The first endoscopy was  after Dr. Daphine Deutscher completed a take down of her previous Nissen  fundoplication and creation of the gastric pouch.  I passed a flexible  Olympus endoscope down the esophagus into the gastric pouch.  I  visualized the gastric pouch and mucosa.  This showed no evidence of  ischemia.  Dr. Daphine Deutscher flooded the upper abdomen.  There was no bubbling  or evidence of leak from this gastric pouch.   The second time endoscopy was after the esophagogastrojejunal  anastomosis.  At this time I again passed the Olympus endoscope down  without difficulty.  I got to the gastric pouch.  I identified the  gastrojejunal anastomosis at about 47-48 cm.  Dr. Daphine Deutscher clamped off the  distal small bowel.  I insufflated air.  There was no leak.  The  anastomosis was approximately 2.5 to 3 cm in diameter and there was no  evidence of bubbling per laparoscopy.   I then withdrew the scope back into the gastric pouch.  The  pouch itself  looked good.  I identified the esophagogastric junction at 40 cm for  about a 7 to 8 cm pouch.   I then withdrew the scope.  The patient tolerated this procedure well.  Dr. Daphine Deutscher will dictate the remainder of the Roux-en-Y gastric bypass.      Sandria Bales. Ezzard Standing, M.D.  Electronically Signed     DHN/MEDQ  D:  01/11/2009  T:  01/11/2009  Job:  [id number]   cc:   Thornton Park Daphine Deutscher, MD  [address], [address]  [place]  [place] [address]

## 2011-03-02 NOTE — H&P (Signed)
[facility]  Patient:    Nicole [name], Nicole [name] Visit Number: [id number] MRN: [id number]          Service Type: DSU Location: [id number] [phone] Attending Physician:  Maxie Better Dictated by:   Sheria Lang. Cherly Hensen, M.D. Admit Date:  04/06/2002                           History and Physical  CHIEF COMPLAINT:              Irregular bleeding, planned hysterectomy.  HISTORY OF PRESENT ILLNESS:   This is a 55 year old gravida 3 para 2-0-1-2 married white female, last menstrual period of Feb 28, 2002, who is now being admitted for total vaginal hysterectomy secondary to dysfunctional uterine bleeding unresponsive to hormonal therapy.  The patient underwent a D&C hysteroscopy in November 2001 for metrorrhagia and endometrial mass.  The pathology showed benign disordered proliferative pattern.  At that time the patient was also complaining of hot flashes and was subsequently placed on hormonal therapy, which did not abate her symptoms despite multiple changes in the medication regimen.  She also had decreased libido for which testosterone cream was prescribed.  In September 2002 the patient presented for her annual examination complaining of irregular and heavy bleeding, despite the continuous hormone replacement therapy.  She underwent an endometrial biopsy which again showed benign disordered proliferative pattern.  The hormone replacement therapy was discontinued.  She was ultimately placed on low-dose birth control pills but continued to have irregular bleeding.  She was also noted to have a large nabothian cyst which ultimately underwent I&D due to irregular bleeding from its opening, and the patient continued to have breakthrough bleeding even on continuous birth control pills.  She now presents essentially for surgical management.  TSH was normal.  FSH in December 2002 was 10.7.  Pap in September 2002 was within normal limits.  PAST MEDICAL  HISTORY:  ALLERGIES:                    CONTRAST DYE, CODEINE.  MEDICATIONS:                  1. Hydrochlorothiazide 25 mg p.o. q.d.                               2. Lipitor 10 mg p.o. q.d.                               3. Prevacid 35 mg p.o. q.d.                               4. Anti-inflammatory medication - which she has                                  stopped due to the planned surgery.                               5. Lamisil.  MEDICAL HISTORY:              Hypercholesterolemia, chronic hypertension, gastroesophageal reflux, arthritis.  SURGERY:  Right shoulder surgery, left fibroadenoma removal, cryosurgery, D&C, back and knee surgery, D&C hysteroscopy.  OBSTETRICAL HISTORY:          Vaginal delivery x2, AB x1.  FAMILY HISTORY:               Maternal aunt with colon cancer.  No ovarian, uterine, or breast cancer, osteoporosis.  Paternal grandmother MI, heart disease.  SOCIAL HISTORY:               Married, two children, nonsmoker.  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  REVIEW OF SYSTEMS:            Negative except as noted in the history of present illness.  All other systems negative.  PHYSICAL EXAMINATION:  GENERAL:                      Well-developed, well-nourished white female in no acute distress.  VITAL SIGNS:                  Blood pressure 110/70, weight 244 pounds.  SKIN:                         Shows no lesions.  HEENT:                        Anicteric sclerae, pink conjunctivae. Oropharynx negative.  HEART:                        Regular rate and rhythm without murmur.  LUNGS:                        Clear to auscultation.  BREASTS:                      Soft, nontender, no palpable mass.  ABDOMEN:                      Soft, obese, nontender.  PELVIC:                       Vulva with no lesions.  Vagina had no discharge. Cervix was parous with pinpoint os.  Uterus about 10 weeks size, nontender. Adnexa nontender, no palpable  mass.  EXTREMITIES:                  No edema, positive varicosities.  NODES:                        No supraclavicular, axillary, or inguinal nodes palpable.  LABORATORY DATA:              Ultrasound on November 19, 2001:  Uterus measured 8.9 x 5.4 x 5.0; small calcified fibroid of 1.7 cm; thin endometrial stripe of 0.3; normal ovaries bilaterally.  At that time a 4.5 cm endocervical cyst noted.  Mammogram on March 31, 2002 was normal.  IMPRESSION:                   1. Dysfunctional uterine bleeding.                               2. Chronic hypertension, stable.  3. Gastroesophageal reflux.                               4. Osteoarthritis.                               5. Hypercholesterolemia.  PLAN:                         1. Admission.                               2. Total vaginal hysterectomy with preservation                                  of ovarian function.                               3. Antiembolic stockings.                               4. Antibiotics prophylaxis.                               5. Resumption of medications.                               6. Routine admission labs.  Risks and benefits of the procedure have been explained to the patient including but not limited to infection; bleeding; injury to surrounding organ structures; inability to complete the procedure vaginally which may necessitate an abdominal incision; risks of needing surgery in the future for ovarian cyst and/or ovarian cancer; blood transfusion for uncontrollable bleeding; the risks of blood transfusion includes acute reaction, HIV transmission, and hepatitis transmission; fistula formation.  Postoperative care and criteria for discharge were reviewed.  All questions answered.Dictated by:   Sheria Lang. Cherly Hensen, M.D. Attending Physician:  Maxie Better DD:  04/04/02 TD:  04/06/02 Job: [id number]

## 2011-07-12 ENCOUNTER — Telehealth (INDEPENDENT_AMBULATORY_CARE_PROVIDER_SITE_OTHER): Payer: Self-pay | Admitting: Surgery

## 2011-07-12 NOTE — Telephone Encounter (Signed)
07/12/11 Recall letter mailed to patient for bariatric surgery follow-up.  Adv pt to call to schedule appt...cef °

## 2012-07-04 ENCOUNTER — Telehealth (INDEPENDENT_AMBULATORY_CARE_PROVIDER_SITE_OTHER): Payer: Self-pay | Admitting: Surgery

## 2012-07-04 NOTE — Telephone Encounter (Signed)
I called patient to advise it is time for her 3 year follow-up appointment to be scheduled for bariatric surgery follow-up. There was no answer at the patient's home phone and no answering machine. No message left....cef

## 2013-09-01 ENCOUNTER — Encounter: Payer: Self-pay | Admitting: Internal Medicine

## 2013-11-04 ENCOUNTER — Encounter: Payer: Self-pay | Admitting: Internal Medicine

## 2013-12-26 ENCOUNTER — Ambulatory Visit: Payer: Self-pay

## 2013-12-26 ENCOUNTER — Ambulatory Visit (INDEPENDENT_AMBULATORY_CARE_PROVIDER_SITE_OTHER): Payer: BC Managed Care – PPO | Admitting: Emergency Medicine

## 2013-12-26 ENCOUNTER — Ambulatory Visit: Payer: BC Managed Care – PPO

## 2013-12-26 VITALS — BP 102/64 | HR 80 | Temp 98.4°F | Resp 16 | Ht 66.0 in | Wt 251.0 lb

## 2013-12-26 DIAGNOSIS — M25579 Pain in unspecified ankle and joints of unspecified foot: Secondary | ICD-10-CM

## 2013-12-26 DIAGNOSIS — S93409A Sprain of unspecified ligament of unspecified ankle, initial encounter: Secondary | ICD-10-CM

## 2013-12-26 MED ORDER — NAPROXEN SODIUM 550 MG PO TABS
550.0000 mg | ORAL_TABLET | Freq: Two times a day (BID) | ORAL | Status: DC
Start: 2013-12-26 — End: 2014-01-20

## 2013-12-26 NOTE — Patient Instructions (Signed)

## 2013-12-26 NOTE — Progress Notes (Signed)
[facility] [address], [name] [id number] [phone]  Date:  12/26/2013   Name:  Nicole [name]   DOB:  1956/03/20   MRN:  [id number]  PCP:  No primary provider on file.    Chief Complaint: Foot Pain   History of Present Illness:  [name] is a 58 y.o. very pleasant female patient who presents with the following:  Injured a week ago when she fell, inverting her right ankle.  Has pain in the lateral foot.  Pain is worse now than previously with time.  Has no other complaint of injury.  Pain increases with ambulation and weight bearing.  No improvement with over the counter medications or other home remedies. Denies other complaint or health concern today.   There are no active problems to display for this patient.   Past Medical History  Diagnosis Date  . Arthritis   . Allergy     Past Surgical History  Procedure Laterality Date  . Abdominal hysterectomy    . Spine surgery      History  Substance Use Topics  . Smoking status: Never Smoker   . Smokeless tobacco: Not on file  . Alcohol Use: Not on file    History reviewed. No pertinent family history.  Allergies  Allergen Reactions  . Codeine     Medication list has been reviewed and updated.  No current outpatient prescriptions on file prior to visit.   No current facility-administered medications on file prior to visit.    Review of Systems:  As per HPI, otherwise negative.    Physical Examination: Filed Vitals:   12/26/13 1752  BP: 102/64  Pulse: 80  Temp: 98.4 F (36.9 C)  Resp: 16   Filed Vitals:   12/26/13 1752  Height: 5\' 6"  (1.676 m)  Weight: 251 lb (113.853 kg)   Body mass index is 40.53 kg/(m^2). Ideal Body Weight: Weight in (lb) to have BMI = 25: 154.6   GEN: WDWN, NAD, Non-toxic, Alert & Oriented x 3 HEENT: Atraumatic, Normocephalic.  Ears and Nose: No external deformity. EXTR: No clubbing/cyanosis/edema   Tender over base fifth MT.  NEURO:  antalgic gait.  PSYCH: Normally interactive. Conversant. Not depressed or anxious appearing.  Calm demeanor.    Assessment and Plan: Sprain ankle Boot Anaprox Follow up in 1 week Signed,  Ellison Carwin, MD  [facility] reading (PRIMARY) by  Dr. Ouida Sills.  Ankle negative.   [facility] reading (PRIMARY) by  Dr. Ouida Sills.  Foot negative.

## 2014-01-20 ENCOUNTER — Other Ambulatory Visit: Payer: Self-pay | Admitting: Emergency Medicine

## 2014-01-20 NOTE — Telephone Encounter (Signed)
Dr Ouida Sills, do you want to RF or does pt need to have re-check?

## 2014-08-17 ENCOUNTER — Other Ambulatory Visit: Payer: Self-pay | Admitting: Internal Medicine

## 2014-08-17 DIAGNOSIS — Z1231 Encounter for screening mammogram for malignant neoplasm of breast: Secondary | ICD-10-CM

## 2014-09-02 ENCOUNTER — Ambulatory Visit: Payer: Self-pay | Admitting: Sports Medicine

## 2014-09-16 ENCOUNTER — Encounter: Payer: Self-pay | Admitting: Sports Medicine

## 2014-09-16 ENCOUNTER — Ambulatory Visit (INDEPENDENT_AMBULATORY_CARE_PROVIDER_SITE_OTHER): Payer: BC Managed Care – PPO | Admitting: Sports Medicine

## 2014-09-16 VITALS — BP 126/61 | Ht 67.0 in | Wt 232.0 lb

## 2014-09-16 DIAGNOSIS — M2020 Hallux rigidus, unspecified foot: Secondary | ICD-10-CM | POA: Insufficient documentation

## 2014-09-16 DIAGNOSIS — M2022 Hallux rigidus, left foot: Secondary | ICD-10-CM | POA: Diagnosis not present

## 2014-09-16 DIAGNOSIS — R269 Unspecified abnormalities of gait and mobility: Secondary | ICD-10-CM

## 2014-09-16 NOTE — Patient Instructions (Signed)
It was great to see you today! Call the office if you have any questions [phone]

## 2014-09-16 NOTE — Progress Notes (Signed)
Nicole [name] - 58 y.o. female MRN [id number]  Date of birth: Feb 14, 1956  Patient was referred from Dr. Elsie Saas  We appreciate patient's referral to our office for custom orthotics  SUBJECTIVE:  Including CC & ROS.  Issue is a 35 her old female presents today in office for fitting for custom orthotics. Patient reports a long-standing history of left ankle pain and flat feet that she has been treating with orthotics in the past. She reports being in rigid ones in the past with discomfort in her midfoot. She's also tried softer orthotics with no relief. She's never had custom orthotics but over-the-counter and from different shoe stores in the past. Left ankle hurts mostly on the lateral aspect of the lateral heel. She gets mild edema and swelling in the joint. She also complains of bilateral first ray bunions left greater than right. Other than treating this with orthotics patient has not done any ankle bracing, and is taking naproxen for intermittent arthritic type pain.  ROS: Review of systems otherwise negative except for information present in HPI  HISTORY: Past Medical, Surgical, Social, and Family History Reviewed & Updated per EMR. Pertinent Historical Findings include: Mild depression otherwise healthy nonsmoker  DATA REVIEWED: Personally reviewed patient's 3 view x-rays of her ankle and three-view x-rays of her foot that show mild arthritis in the ankle joints bilaterally, moderate first MTP joint arthritis of the right foot, severe hallux rigidus and valgus of the first MTP on the left foot.  PHYSICAL EXAM:  VS: BP:126/61 mmHg  HR: bpm  TEMP: ( )  RESP:   HT:5\' 7"  (170.2 cm)   WT:232 lb (105.235 kg)  BMI:36.4 Left FOOT EXAM:  General: well nourished Skin of LE: warm; dry, no rashes, lesions, ecchymosis or erythema. Vascular: Dorsal pedal pulses 2+ bilaterally Neurologically: Sensation to light touch lower extremities equal and intact bilaterally.  Observation - no  ecchymosis, no edema, or hematoma present  Palpation: Mild lateral ankle pain the left foot with palpation around the flexor tendons Normal ankle motion and strength bilaterally  Extension/flexion 5/5 strength bilaterally in toes Weight-bearing foot exam:  First ray: Bilateral hallux rigidus and valgus left greater than right  Second ray: Slightly longer than the first bilaterally Longitudinal arch: Tennis playing tennis Heel position: Valgus stress Gait analysis:  Striking location: Post right between the second and third toe Foot motion: Patient pronates with extending her fourth and fifth rays when dorsiflexing during her initial phase Knee motion: Neutral Hip motion: Neutral  ASSESSMENT & PLAN: See problem based charting & AVS for pt instructions. Patient presented with chronic intermittent foot pain suspect is related to dysfunction in her gait, gait abnormalities that are related to pes planus in her foot and bilateral hallux rigidus. Patient was made custom orthotics to help correct some his gait abnormalities. She will follow-up based on her clinical response to these orthotics.  Orthotic Fitting and Adjustment note: Patient was fitted for a : standard, cushioned, semi-rigid orthotic.  The orthotic was heated and afterward the patient stood on the orthotic blank positioned on the orthotic stand.  The patient was positioned in subtalar neutral position and 10 degrees of ankle dorsiflexion in a weight bearing stance.  After completion of molding, a stable base was applied to the orthotic blank.  The blank was ground to a stable position for weight bearing.  Size: 9 Base: Blue EVA Posting: posting to midfoot and all 5 rays of the left foot, and 1st ray post of the  right Additional orthotic padding: no wedges and remove wedge for other insole  Greater than 50% of the patient's visit for a total of 30 minutes, was spent conducting face-to-face counseling for bilateral foot orthotics  fitting and constructing

## 2014-10-15 DIAGNOSIS — I219 Acute myocardial infarction, unspecified: Secondary | ICD-10-CM

## 2014-10-15 HISTORY — DX: Acute myocardial infarction, unspecified: I21.9

## 2014-12-01 ENCOUNTER — Ambulatory Visit: Payer: Self-pay

## 2015-06-17 ENCOUNTER — Other Ambulatory Visit: Payer: Self-pay | Admitting: Internal Medicine

## 2015-06-17 DIAGNOSIS — Z1231 Encounter for screening mammogram for malignant neoplasm of breast: Secondary | ICD-10-CM

## 2015-06-29 ENCOUNTER — Other Ambulatory Visit: Payer: Self-pay | Admitting: Internal Medicine

## 2015-06-29 DIAGNOSIS — E2839 Other primary ovarian failure: Secondary | ICD-10-CM

## 2015-07-05 ENCOUNTER — Inpatient Hospital Stay: Admission: RE | Admit: 2015-07-05 | Payer: BC Managed Care – PPO | Source: Ambulatory Visit

## 2015-07-05 ENCOUNTER — Ambulatory Visit: Payer: BC Managed Care – PPO

## 2015-07-13 ENCOUNTER — Encounter (HOSPITAL_COMMUNITY): Payer: Self-pay | Admitting: Physician Assistant

## 2015-07-13 DIAGNOSIS — M1712 Unilateral primary osteoarthritis, left knee: Secondary | ICD-10-CM | POA: Diagnosis present

## 2015-07-13 NOTE — H&P (Signed)
TOTAL KNEE ADMISSION H&P  Patient is being admitted for left total knee arthroplasty.  Subjective:  Chief Complaint:left knee pain.  HPI: Nicole [name], 59 y.o. female, has a history of pain and functional disability in the left knee due to arthritis and has failed non-surgical conservative treatments for greater than 12 weeks to includeNSAID's and/or analgesics, corticosteriod injections, viscosupplementation injections, flexibility and strengthening excercises, supervised PT with diminished ADL's post treatment, weight reduction as appropriate and activity modification.  Onset of symptoms was gradual, starting 10 years ago with gradually worsening course since that time. The patient noted prior procedures on the knee to include  arthroscopy and menisectomy on the left knee(s).  Patient currently rates pain in the left knee(s) at 10 out of 10 with activity. Patient has night pain, worsening of pain with activity and weight bearing, pain that interferes with activities of daily living, crepitus and joint swelling.  Patient has evidence of subchondral sclerosis, periarticular osteophytes and joint space narrowing by imaging studies.There is no active infection.  Patient Active Problem List   Diagnosis Date Noted  . Primary localized osteoarthritis of left knee   . Abnormality of gait 09/16/2014  . Hallux rigidus of left foot 09/16/2014  . 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 Arthritis   . Allergy   . Primary localized osteoarthritis of left knee     Past Surgical History  Procedure Laterality Date  . Abdominal hysterectomy    . Spine surgery      No prescriptions prior to admission   Allergies  Allergen Reactions  . Codeine     Social History  Substance Use Topics  . Smoking status: Never Smoker   . Smokeless tobacco: Not on file  . Alcohol Use: No    No family history on file.   Review of Systems  Constitutional: Negative.    HENT: Negative.   Eyes: Negative.   Respiratory: Negative.   Cardiovascular: Negative.   Gastrointestinal: Negative.   Genitourinary: Negative.   Musculoskeletal: Positive for back pain and joint pain.  Skin: Negative.   Neurological: Negative.   Endo/Heme/Allergies: Negative.   Psychiatric/Behavioral: Negative.     Objective:  Physical Exam  Constitutional: She is oriented to person, place, and time. She appears well-developed and well-nourished.  HENT:  Head: Normocephalic and atraumatic.  Mouth/Throat: Oropharynx is clear and moist.  Eyes: Conjunctivae are normal. Pupils are equal, round, and reactive to light.  Neck: Neck supple.  Cardiovascular: Normal rate.   Respiratory: Effort normal and breath sounds normal.  GI: Soft. Bowel sounds are normal.  Genitourinary:  Not pertinent to current symptomatology therefore not examined.  Musculoskeletal:   Examination of her left knee reveals pain medially and laterally. There is 1+ crepitation and 1+ synovitis.  Her range of motion is -5-115 degrees.  The knee is stable with normal patellar tracking. Examination of the right knee reveals minimal pain. There is 1+ synovitis. Full range of motion.  The knee is stable with normal patellar tracking. Vascular exam: pulses 2+ and symmetric.   Neurological: She is alert and oriented to person, place, and time.  Skin: Skin is warm and dry.  Psychiatric: She has a normal mood and affect. Her behavior is normal.    Vital signs in last 24 hours:    Labs:   Estimated body mass index is 36.33 kg/(m^2) as calculated from the following:   Height as of 09/16/14: 5\' 7"  (1.702 m).  Weight as of 09/16/14: 105.235 kg (232 lb).   Imaging Review Plain radiographs demonstrate moderate degenerative joint disease of the left knee(s). The overall alignment issignificant varus. The bone quality appears to be good for age and reported activity level.  Assessment/Plan:  End stage arthritis, left  knee  Active Problems:   Primary localized osteoarthritis of left knee   The patient history, physical examination, clinical judgment of the provider and imaging studies are consistent with end stage degenerative joint disease of the left knee(s) and total knee arthroplasty is deemed medically necessary. The treatment options including medical management, injection therapy arthroscopy and arthroplasty were discussed at length. The risks and benefits of total knee arthroplasty were presented and reviewed. The risks due to aseptic loosening, infection, stiffness, patella tracking problems, thromboembolic complications and other imponderables were discussed. The patient acknowledged the explanation, agreed to proceed with the plan and consent was signed. Patient is being admitted for inpatient treatment for surgery, pain control, PT, OT, prophylactic antibiotics, VTE prophylaxis, progressive ambulation and ADL's and discharge planning. The patient is planning to be discharged home with home health services

## 2015-07-14 ENCOUNTER — Encounter (HOSPITAL_COMMUNITY)
Admission: RE | Admit: 2015-07-14 | Discharge: 2015-07-14 | Disposition: A | Discharge status: Home / Self Care | Payer: BC Managed Care – PPO | Source: Ambulatory Visit | Attending: Orthopedic Surgery | Admitting: Orthopedic Surgery

## 2015-07-14 ENCOUNTER — Other Ambulatory Visit (HOSPITAL_COMMUNITY): Payer: Self-pay | Admitting: *Deleted

## 2015-07-14 ENCOUNTER — Encounter (HOSPITAL_COMMUNITY): Payer: Self-pay

## 2015-07-14 DIAGNOSIS — R9431 Abnormal electrocardiogram [ECG] [EKG]: Secondary | ICD-10-CM | POA: Diagnosis not present

## 2015-07-14 DIAGNOSIS — Z0183 Encounter for blood typing: Secondary | ICD-10-CM | POA: Insufficient documentation

## 2015-07-14 DIAGNOSIS — M179 Osteoarthritis of knee, unspecified: Secondary | ICD-10-CM | POA: Insufficient documentation

## 2015-07-14 DIAGNOSIS — Z01812 Encounter for preprocedural laboratory examination: Secondary | ICD-10-CM | POA: Diagnosis present

## 2015-07-14 HISTORY — DX: Anemia, unspecified: D64.9

## 2015-07-14 HISTORY — DX: Hyperlipidemia, unspecified: E78.5

## 2015-07-14 HISTORY — DX: Essential (primary) hypertension: I10

## 2015-07-14 LAB — CBC WITH DIFFERENTIAL/PLATELET
BASOS ABS: 0.1 10*3/uL (ref 0.0–0.1)
Basophils Relative: 1 %
Eosinophils Absolute: 0.2 10*3/uL (ref 0.0–0.7)
Eosinophils Relative: 3 %
HEMATOCRIT: 39.1 % (ref 36.0–46.0)
Hemoglobin: 12.8 g/dL (ref 12.0–15.0)
LYMPHS PCT: 30 %
Lymphs Abs: 2.3 10*3/uL (ref 0.7–4.0)
MCH: 29.3 pg (ref 26.0–34.0)
MCHC: 32.7 g/dL (ref 30.0–36.0)
MCV: 89.5 fL (ref 78.0–100.0)
Monocytes Absolute: 0.5 10*3/uL (ref 0.1–1.0)
Monocytes Relative: 7 %
NEUTROS ABS: 4.4 10*3/uL (ref 1.7–7.7)
Neutrophils Relative %: 59 %
PLATELETS: 246 10*3/uL (ref 150–400)
RBC: 4.37 MIL/uL (ref 3.87–5.11)
RDW: 14.2 % (ref 11.5–15.5)
WBC: 7.6 10*3/uL (ref 4.0–10.5)

## 2015-07-14 LAB — COMPREHENSIVE METABOLIC PANEL
ALT: 28 U/L (ref 14–54)
AST: 24 U/L (ref 15–41)
Albumin: 4.3 g/dL (ref 3.5–5.0)
Alkaline Phosphatase: 70 U/L (ref 38–126)
Anion gap: 7 (ref 5–15)
BILIRUBIN TOTAL: 1.1 mg/dL (ref 0.3–1.2)
BUN: 10 mg/dL (ref 6–20)
CHLORIDE: 106 mmol/L (ref 101–111)
CO2: 28 mmol/L (ref 22–32)
CREATININE: 0.97 mg/dL (ref 0.44–1.00)
Calcium: 9.5 mg/dL (ref 8.9–10.3)
GFR calc Af Amer: >60 mL/min (ref >60)
GLUCOSE: 116 mg/dL — AB (ref 65–99)
Potassium: 4.5 mmol/L (ref 3.5–5.1)
Sodium: 141 mmol/L (ref 135–145)
Total Protein: 7.4 g/dL (ref 6.5–8.1)

## 2015-07-14 LAB — PROTIME-INR
INR: 1.09 (ref 0.00–1.49)
PROTHROMBIN TIME: 14.2 s (ref 11.6–15.2)

## 2015-07-14 LAB — SURGICAL PCR SCREEN
MRSA, PCR: NEGATIVE
Staphylococcus aureus: NEGATIVE

## 2015-07-14 LAB — TYPE AND SCREEN
ABO/RH(D): A POS
Antibody Screen: NEGATIVE

## 2015-07-14 LAB — APTT: APTT: 36 s (ref 24–37)

## 2015-07-14 LAB — ABO/RH: ABO/RH(D): A POS

## 2015-07-14 MED ORDER — CHLORHEXIDINE GLUCONATE 4 % EX LIQD: 60.0000 mL | Freq: Once | CUTANEOUS | Status: DC

## 2015-07-14 NOTE — Pre-Procedure Instructions (Signed)
    ARMELLA STOGNER  07/14/2015      [id number] - [place] [address], [place] [address] [place] [address] Phone: [phone] Fax: [phone]    Your procedure is scheduled on Monday, July 25, 2015 at 9:00 AM.  Report to [facility] Entrance "A" Admitting Office at 7:00 AM.  Call this number if you have problems the morning of surgery: [phone]  Any questions prior to day of surgery, please call [phone] between 8 & 4 PM.   Remember:  Do not eat food or drink liquids after midnight Sunday, 07/24/15.  Take these medicines the morning of surgery with A SIP OF WATER: Bupropion (Wellbutrin), Citalopram (Celexa), Gabapentin (Neurontin), Hydrocodone - if needed  Stop NSAIDS (Meloxicam, Ibuprofen, Aleve, etc.) 7 days prior to surgery   Do not wear jewelry, make-up or nail polish.  Do not wear lotions, powders, or perfumes.  You may wear deodorant.  Do not shave 48 hours prior to surgery.    Do not bring valuables to the hospital.  [facility] is not responsible for any belongings or valuables.  Contacts, dentures or bridgework may not be worn into surgery.  Leave your suitcase in the car.  After surgery it may be brought to your room.  For patients admitted to the hospital, discharge time will be determined by your treatment team.  Special instructions:  See "Preparing for Surgery" Instruction sheet.   Please read over the following fact sheets that you were given. Pain Booklet, Coughing and Deep Breathing, Blood Transfusion Information, MRSA Information and Surgical Site Infection Prevention

## 2015-07-15 LAB — URINE CULTURE

## 2015-07-24 MED ORDER — DEXTROSE 5 % IV SOLN
3.0000 g | INTRAVENOUS | Status: AC
  Administered 2015-07-25: 3 g via INTRAVENOUS
  Filled 2015-07-24: qty 3000

## 2015-07-25 ENCOUNTER — Encounter (HOSPITAL_COMMUNITY)
Admission: RE | Disposition: A | Discharge status: Home Health | Payer: Self-pay | Source: Ambulatory Visit | Attending: Orthopedic Surgery

## 2015-07-25 ENCOUNTER — Inpatient Hospital Stay (HOSPITAL_COMMUNITY): Payer: BC Managed Care – PPO | Admitting: Certified Registered Nurse Anesthetist

## 2015-07-25 ENCOUNTER — Inpatient Hospital Stay (HOSPITAL_COMMUNITY)
Admission: RE | Admit: 2015-07-25 | Discharge: 2015-07-27 | DRG: 470 | Disposition: A | Discharge status: Home Health | Payer: BC Managed Care – PPO | Source: Ambulatory Visit | Attending: Orthopedic Surgery | Admitting: Orthopedic Surgery

## 2015-07-25 ENCOUNTER — Encounter (HOSPITAL_COMMUNITY): Payer: Self-pay | Admitting: *Deleted

## 2015-07-25 ENCOUNTER — Inpatient Hospital Stay (HOSPITAL_COMMUNITY): Payer: BC Managed Care – PPO | Admitting: Emergency Medicine

## 2015-07-25 DIAGNOSIS — E785 Hyperlipidemia, unspecified: Secondary | ICD-10-CM | POA: Diagnosis present

## 2015-07-25 DIAGNOSIS — M549 Dorsalgia, unspecified: Secondary | ICD-10-CM | POA: Diagnosis present

## 2015-07-25 DIAGNOSIS — I10 Essential (primary) hypertension: Secondary | ICD-10-CM | POA: Diagnosis present

## 2015-07-25 DIAGNOSIS — D509 Iron deficiency anemia, unspecified: Secondary | ICD-10-CM | POA: Diagnosis present

## 2015-07-25 DIAGNOSIS — M2022 Hallux rigidus, left foot: Secondary | ICD-10-CM | POA: Diagnosis present

## 2015-07-25 DIAGNOSIS — M1710 Unilateral primary osteoarthritis, unspecified knee: Secondary | ICD-10-CM | POA: Diagnosis present

## 2015-07-25 DIAGNOSIS — M179 Osteoarthritis of knee, unspecified: Secondary | ICD-10-CM | POA: Diagnosis present

## 2015-07-25 DIAGNOSIS — R269 Unspecified abnormalities of gait and mobility: Secondary | ICD-10-CM

## 2015-07-25 DIAGNOSIS — M25562 Pain in left knee: Secondary | ICD-10-CM | POA: Diagnosis present

## 2015-07-25 DIAGNOSIS — M1712 Unilateral primary osteoarthritis, left knee: Secondary | ICD-10-CM | POA: Diagnosis present

## 2015-07-25 HISTORY — DX: Dorsalgia, unspecified: M54.9

## 2015-07-25 HISTORY — PX: TOTAL KNEE ARTHROPLASTY: SHX125

## 2015-07-25 HISTORY — DX: Unilateral primary osteoarthritis, left knee: M17.12

## 2015-07-25 SURGERY — ARTHROPLASTY, KNEE, TOTAL
Anesthesia: General | Laterality: Left

## 2015-07-25 MED ORDER — POVIDONE-IODINE 7.5 % EX SOLN
Freq: Once | CUTANEOUS | Status: DC
  Filled 2015-07-25: qty 118

## 2015-07-25 MED ORDER — IRBESARTAN 300 MG PO TABS
300.0000 mg | ORAL_TABLET | Freq: Every day | ORAL | Status: DC
  Administered 2015-07-27: 300 mg via ORAL
  Filled 2015-07-25: qty 1

## 2015-07-25 MED ORDER — HYDROMORPHONE HCL 1 MG/ML IJ SOLN
INTRAMUSCULAR | Status: AC
  Filled 2015-07-25: qty 1

## 2015-07-25 MED ORDER — MIDAZOLAM HCL 2 MG/2ML IJ SOLN
INTRAMUSCULAR | Status: AC
  Administered 2015-07-25: 2 mg via INTRAVENOUS
  Filled 2015-07-25: qty 2

## 2015-07-25 MED ORDER — GABAPENTIN 600 MG PO TABS
600.0000 mg | ORAL_TABLET | Freq: Every day | ORAL | Status: DC
  Administered 2015-07-25 – 2015-07-26 (×2): 600 mg via ORAL
  Filled 2015-07-25 (×4): qty 1

## 2015-07-25 MED ORDER — OXYCODONE HCL 5 MG PO TABS
5.0000 mg | ORAL_TABLET | Freq: Once | ORAL | Status: AC | PRN
  Administered 2015-07-25: 5 mg via ORAL

## 2015-07-25 MED ORDER — ACETAMINOPHEN 650 MG RE SUPP: 650.0000 mg | Freq: Four times a day (QID) | RECTAL | Status: DC | PRN

## 2015-07-25 MED ORDER — POTASSIUM CHLORIDE IN NACL 20-0.9 MEQ/L-% IV SOLN
INTRAVENOUS | Status: DC
  Administered 2015-07-25 – 2015-07-26 (×2): via INTRAVENOUS
  Filled 2015-07-25 (×2): qty 1000

## 2015-07-25 MED ORDER — PROPOFOL 10 MG/ML IV BOLUS
INTRAVENOUS | Status: AC
  Filled 2015-07-25: qty 20

## 2015-07-25 MED ORDER — HYDROMORPHONE HCL 1 MG/ML IJ SOLN
0.2500 mg | INTRAMUSCULAR | Status: DC | PRN
  Administered 2015-07-25 (×4): 0.5 mg via INTRAVENOUS

## 2015-07-25 MED ORDER — MIDAZOLAM HCL 2 MG/2ML IJ SOLN
INTRAMUSCULAR | Status: AC
  Filled 2015-07-25: qty 4

## 2015-07-25 MED ORDER — OXYCODONE HCL 5 MG/5ML PO SOLN: 5.0000 mg | Freq: Once | ORAL | Status: AC | PRN

## 2015-07-25 MED ORDER — PROPOFOL 500 MG/50ML IV EMUL
INTRAVENOUS | Status: DC | PRN
  Administered 2015-07-25: 11:00:00 via INTRAVENOUS
  Administered 2015-07-25: 25 ug/kg/min via INTRAVENOUS

## 2015-07-25 MED ORDER — ASPIRIN EC 325 MG PO TBEC
325.0000 mg | DELAYED_RELEASE_TABLET | Freq: Every day | ORAL | Status: DC
  Administered 2015-07-26 – 2015-07-27 (×2): 325 mg via ORAL
  Filled 2015-07-25 (×3): qty 1

## 2015-07-25 MED ORDER — DEXAMETHASONE SODIUM PHOSPHATE 10 MG/ML IJ SOLN
INTRAMUSCULAR | Status: DC | PRN
  Administered 2015-07-25: 10 mg via INTRAVENOUS

## 2015-07-25 MED ORDER — ACETAMINOPHEN 325 MG PO TABS: 650.0000 mg | ORAL_TABLET | Freq: Four times a day (QID) | ORAL | Status: DC | PRN

## 2015-07-25 MED ORDER — LIDOCAINE HCL (CARDIAC) 20 MG/ML IV SOLN
INTRAVENOUS | Status: AC
  Filled 2015-07-25: qty 5

## 2015-07-25 MED ORDER — METOCLOPRAMIDE HCL 5 MG/ML IJ SOLN: 5.0000 mg | Freq: Three times a day (TID) | INTRAMUSCULAR | Status: DC | PRN

## 2015-07-25 MED ORDER — MENTHOL 3 MG MT LOZG: 1.0000 | LOZENGE | OROMUCOSAL | Status: DC | PRN

## 2015-07-25 MED ORDER — FENTANYL CITRATE (PF) 250 MCG/5ML IJ SOLN
INTRAMUSCULAR | Status: AC
  Filled 2015-07-25: qty 5

## 2015-07-25 MED ORDER — CELECOXIB 200 MG PO CAPS
200.0000 mg | ORAL_CAPSULE | Freq: Two times a day (BID) | ORAL | Status: DC
  Administered 2015-07-25 – 2015-07-27 (×4): 200 mg via ORAL
  Filled 2015-07-25 (×4): qty 1

## 2015-07-25 MED ORDER — OXYCODONE HCL ER 10 MG PO T12A
20.0000 mg | EXTENDED_RELEASE_TABLET | Freq: Two times a day (BID) | ORAL | Status: DC
  Administered 2015-07-25 – 2015-07-27 (×5): 20 mg via ORAL
  Filled 2015-07-25 (×5): qty 2

## 2015-07-25 MED ORDER — ACETAMINOPHEN 160 MG/5ML PO SOLN
325.0000 mg | ORAL | Status: DC | PRN
  Filled 2015-07-25: qty 20.3

## 2015-07-25 MED ORDER — LACTATED RINGERS IV SOLN
INTRAVENOUS | Status: DC
  Administered 2015-07-25 (×2): via INTRAVENOUS

## 2015-07-25 MED ORDER — ARTIFICIAL TEARS OP OINT
TOPICAL_OINTMENT | OPHTHALMIC | Status: AC
  Filled 2015-07-25: qty 3.5

## 2015-07-25 MED ORDER — DOCUSATE SODIUM 100 MG PO CAPS
100.0000 mg | ORAL_CAPSULE | Freq: Two times a day (BID) | ORAL | Status: DC
  Administered 2015-07-25 – 2015-07-26 (×3): 100 mg via ORAL
  Filled 2015-07-25 (×4): qty 1

## 2015-07-25 MED ORDER — POLYETHYLENE GLYCOL 3350 17 G PO PACK
17.0000 g | PACK | Freq: Two times a day (BID) | ORAL | Status: DC
  Administered 2015-07-26: 17 g via ORAL
  Filled 2015-07-25 (×4): qty 1

## 2015-07-25 MED ORDER — BUPIVACAINE-EPINEPHRINE 0.25% -1:200000 IJ SOLN
INTRAMUSCULAR | Status: DC | PRN
  Administered 2015-07-25: 30 mL

## 2015-07-25 MED ORDER — DEXAMETHASONE SODIUM PHOSPHATE 10 MG/ML IJ SOLN
10.0000 mg | Freq: Three times a day (TID) | INTRAMUSCULAR | Status: DC
  Administered 2015-07-25 – 2015-07-27 (×6): 10 mg via INTRAVENOUS
  Filled 2015-07-25 (×5): qty 1

## 2015-07-25 MED ORDER — BUPIVACAINE IN DEXTROSE 0.75-8.25 % IT SOLN
INTRATHECAL | Status: DC | PRN
  Administered 2015-07-25: 1.8 mL via INTRATHECAL

## 2015-07-25 MED ORDER — LACTATED RINGERS IV SOLN: INTRAVENOUS | Status: DC

## 2015-07-25 MED ORDER — OXYCODONE HCL 5 MG PO TABS
5.0000 mg | ORAL_TABLET | ORAL | Status: DC | PRN
  Administered 2015-07-25 – 2015-07-26 (×4): 10 mg via ORAL
  Administered 2015-07-26: 5 mg via ORAL
  Administered 2015-07-26 – 2015-07-27 (×3): 10 mg via ORAL
  Filled 2015-07-25 (×2): qty 2
  Filled 2015-07-25: qty 1
  Filled 2015-07-25 (×6): qty 2

## 2015-07-25 MED ORDER — BUPIVACAINE-EPINEPHRINE (PF) 0.25% -1:200000 IJ SOLN
INTRAMUSCULAR | Status: AC
  Filled 2015-07-25: qty 30

## 2015-07-25 MED ORDER — ACETAMINOPHEN 325 MG PO TABS: 325.0000 mg | ORAL_TABLET | ORAL | Status: DC | PRN

## 2015-07-25 MED ORDER — 0.9 % SODIUM CHLORIDE (POUR BTL) OPTIME
TOPICAL | Status: DC | PRN
  Administered 2015-07-25: 1000 mL

## 2015-07-25 MED ORDER — SODIUM CHLORIDE 0.9 % IR SOLN
Status: DC | PRN
  Administered 2015-07-25: 1000 mL

## 2015-07-25 MED ORDER — PHENOL 1.4 % MT LIQD: 1.0000 | OROMUCOSAL | Status: DC | PRN

## 2015-07-25 MED ORDER — METOCLOPRAMIDE HCL 5 MG PO TABS: 5.0000 mg | ORAL_TABLET | Freq: Three times a day (TID) | ORAL | Status: DC | PRN

## 2015-07-25 MED ORDER — FENTANYL CITRATE (PF) 100 MCG/2ML IJ SOLN
INTRAMUSCULAR | Status: AC
  Administered 2015-07-25: 100 ug via INTRAVENOUS
  Filled 2015-07-25: qty 2

## 2015-07-25 MED ORDER — NEOSTIGMINE METHYLSULFATE 10 MG/10ML IV SOLN
INTRAVENOUS | Status: AC
  Filled 2015-07-25: qty 1

## 2015-07-25 MED ORDER — ONDANSETRON HCL 4 MG/2ML IJ SOLN: 4.0000 mg | Freq: Four times a day (QID) | INTRAMUSCULAR | Status: DC | PRN

## 2015-07-25 MED ORDER — BUPIVACAINE-EPINEPHRINE (PF) 0.5% -1:200000 IJ SOLN
INTRAMUSCULAR | Status: DC | PRN
  Administered 2015-07-25: 15 mL via PERINEURAL

## 2015-07-25 MED ORDER — DIPHENHYDRAMINE HCL 12.5 MG/5ML PO ELIX: 12.5000 mg | ORAL_SOLUTION | ORAL | Status: DC | PRN

## 2015-07-25 MED ORDER — CITALOPRAM HYDROBROMIDE 40 MG PO TABS
40.0000 mg | ORAL_TABLET | Freq: Every day | ORAL | Status: DC
  Administered 2015-07-26 – 2015-07-27 (×2): 40 mg via ORAL
  Filled 2015-07-25 (×2): qty 1

## 2015-07-25 MED ORDER — ONDANSETRON HCL 4 MG/2ML IJ SOLN
INTRAMUSCULAR | Status: AC
  Filled 2015-07-25: qty 2

## 2015-07-25 MED ORDER — BUPROPION HCL ER (SR) 150 MG PO TB12
150.0000 mg | ORAL_TABLET | Freq: Two times a day (BID) | ORAL | Status: DC
  Administered 2015-07-25 – 2015-07-27 (×4): 150 mg via ORAL
  Filled 2015-07-25 (×4): qty 1

## 2015-07-25 MED ORDER — DEXAMETHASONE SODIUM PHOSPHATE 10 MG/ML IJ SOLN
INTRAMUSCULAR | Status: AC
  Filled 2015-07-25: qty 1

## 2015-07-25 MED ORDER — GLYCOPYRROLATE 0.2 MG/ML IJ SOLN
INTRAMUSCULAR | Status: AC
  Filled 2015-07-25: qty 3

## 2015-07-25 MED ORDER — OXYCODONE HCL 5 MG PO TABS
ORAL_TABLET | ORAL | Status: AC
  Filled 2015-07-25: qty 1

## 2015-07-25 MED ORDER — ONDANSETRON HCL 4 MG PO TABS: 4.0000 mg | ORAL_TABLET | Freq: Four times a day (QID) | ORAL | Status: DC | PRN

## 2015-07-25 MED ORDER — CEFAZOLIN SODIUM-DEXTROSE 2-3 GM-% IV SOLR
2.0000 g | Freq: Four times a day (QID) | INTRAVENOUS | Status: AC
  Administered 2015-07-25 (×2): 2 g via INTRAVENOUS
  Filled 2015-07-25 (×3): qty 50

## 2015-07-25 MED ORDER — ALUM & MAG HYDROXIDE-SIMETH 200-200-20 MG/5ML PO SUSP
30.0000 mL | ORAL | Status: DC | PRN
  Filled 2015-07-25: qty 30

## 2015-07-25 MED ORDER — HYDROMORPHONE HCL 1 MG/ML IJ SOLN
1.0000 mg | INTRAMUSCULAR | Status: DC | PRN
  Administered 2015-07-25 – 2015-07-27 (×6): 1 mg via INTRAVENOUS
  Filled 2015-07-25 (×6): qty 1

## 2015-07-25 MED ORDER — GABAPENTIN 300 MG PO CAPS
300.0000 mg | ORAL_CAPSULE | Freq: Every morning | ORAL | Status: DC
  Administered 2015-07-26 – 2015-07-27 (×2): 300 mg via ORAL
  Filled 2015-07-25 (×2): qty 1

## 2015-07-25 SURGICAL SUPPLY — 78 items
APL SKNCLS STERI-STRIP NONHPOA (GAUZE/BANDAGES/DRESSINGS) ×1
BANDAGE ESMARK 6X9 LF (GAUZE/BANDAGES/DRESSINGS) ×1 IMPLANT
BENZOIN TINCTURE PRP APPL 2/3 (GAUZE/BANDAGES/DRESSINGS) ×3 IMPLANT
BLADE SAGITTAL 25.0X1.19X90 (BLADE) ×2 IMPLANT
BLADE SAGITTAL 25.0X1.19X90MM (BLADE) ×1
BLADE SAW SGTL 11.0X1.19X90.0M (BLADE) IMPLANT
BLADE SAW SGTL 13.0X1.19X90.0M (BLADE) ×3 IMPLANT
BLADE SURG 10 STRL SS (BLADE) ×6 IMPLANT
BNDG CMPR 9X6 STRL LF SNTH (GAUZE/BANDAGES/DRESSINGS) ×1
BNDG CMPR MED 15X6 ELC VLCR LF (GAUZE/BANDAGES/DRESSINGS) ×1
BNDG ELASTIC 6X15 VLCR STRL LF (GAUZE/BANDAGES/DRESSINGS) ×3 IMPLANT
BNDG ESMARK 6X9 LF (GAUZE/BANDAGES/DRESSINGS) ×3
BOWL SMART MIX CTS (DISPOSABLE) ×3 IMPLANT
CAPT KNEE TOTAL 3 ATTUNE ×2 IMPLANT
CEMENT HV SMART SET (Cement) ×6 IMPLANT
CLOSURE WOUND 1/2 X4 (GAUZE/BANDAGES/DRESSINGS) ×1
COVER SURGICAL LIGHT HANDLE (MISCELLANEOUS) ×3 IMPLANT
CUFF TOURNIQUET SINGLE 34IN LL (TOURNIQUET CUFF) ×3 IMPLANT
CUFF TOURNIQUET SINGLE 44IN (TOURNIQUET CUFF) IMPLANT
DECANTER SPIKE VIAL GLASS SM (MISCELLANEOUS) ×3 IMPLANT
DRAPE EXTREMITY T 121X128X90 (DRAPE) ×3 IMPLANT
DRAPE INCISE IOBAN 66X45 STRL (DRAPES) ×3 IMPLANT
DRAPE PROXIMA HALF (DRAPES) ×3 IMPLANT
DRAPE U-SHAPE 47X51 STRL (DRAPES) ×3 IMPLANT
DRSG AQUACEL AG ADV 3.5X14 (GAUZE/BANDAGES/DRESSINGS) ×3 IMPLANT
DRSG PAD ABDOMINAL 8X10 ST (GAUZE/BANDAGES/DRESSINGS) ×6 IMPLANT
DURAPREP 26ML APPLICATOR (WOUND CARE) ×6 IMPLANT
ELECT CAUTERY BLADE 6.4 (BLADE) ×3 IMPLANT
ELECT REM PT RETURN 9FT ADLT (ELECTROSURGICAL) ×3
ELECTRODE REM PT RTRN 9FT ADLT (ELECTROSURGICAL) ×1 IMPLANT
EVACUATOR 1/8 PVC DRAIN (DRAIN) ×3 IMPLANT
FACESHIELD WRAPAROUND (MASK) ×3 IMPLANT
FACESHIELD WRAPAROUND OR TEAM (MASK) ×1 IMPLANT
GAUZE SPONGE 4X4 12PLY STRL (GAUZE/BANDAGES/DRESSINGS) ×3 IMPLANT
GLOVE BIO SURGEON STRL SZ7 (GLOVE) ×5 IMPLANT
GLOVE BIO SURGEON STRL SZ7.5 (GLOVE) ×4 IMPLANT
GLOVE BIOGEL PI IND STRL 7.0 (GLOVE) ×1 IMPLANT
GLOVE BIOGEL PI IND STRL 7.5 (GLOVE) ×1 IMPLANT
GLOVE BIOGEL PI INDICATOR 7.0 (GLOVE) ×2
GLOVE BIOGEL PI INDICATOR 7.5 (GLOVE) ×2
GLOVE SS BIOGEL STRL SZ 7.5 (GLOVE) ×1 IMPLANT
GLOVE SUPERSENSE BIOGEL SZ 7.5 (GLOVE) ×2
GLOVE SURG SS PI 6.5 STRL IVOR (GLOVE) ×2 IMPLANT
GOWN STRL REUS W/ TWL LRG LVL3 (GOWN DISPOSABLE) ×1 IMPLANT
GOWN STRL REUS W/ TWL XL LVL3 (GOWN DISPOSABLE) ×2 IMPLANT
GOWN STRL REUS W/TWL LRG LVL3 (GOWN DISPOSABLE) ×3
GOWN STRL REUS W/TWL XL LVL3 (GOWN DISPOSABLE) ×6
HANDPIECE INTERPULSE COAX TIP (DISPOSABLE) ×3
HOOD PEEL AWAY FACE SHEILD DIS (HOOD) ×6 IMPLANT
IMMOBILIZER KNEE 22 UNIV (SOFTGOODS) ×3 IMPLANT
KIT BASIN OR (CUSTOM PROCEDURE TRAY) ×3 IMPLANT
KIT ROOM TURNOVER OR (KITS) ×3 IMPLANT
MANIFOLD NEPTUNE II (INSTRUMENTS) ×3 IMPLANT
MARKER SKIN DUAL TIP RULER LAB (MISCELLANEOUS) ×3 IMPLANT
NS IRRIG 1000ML POUR BTL (IV SOLUTION) ×3 IMPLANT
PACK TOTAL JOINT (CUSTOM PROCEDURE TRAY) ×3 IMPLANT
PACK UNIVERSAL I (CUSTOM PROCEDURE TRAY) ×3 IMPLANT
PAD ARMBOARD 7.5X6 YLW CONV (MISCELLANEOUS) ×6 IMPLANT
PADDING CAST COTTON 6X4 STRL (CAST SUPPLIES) ×3 IMPLANT
RUBBERBAND STERILE (MISCELLANEOUS) ×3 IMPLANT
SET HNDPC FAN SPRY TIP SCT (DISPOSABLE) ×1 IMPLANT
STRIP CLOSURE SKIN 1/2X4 (GAUZE/BANDAGES/DRESSINGS) ×2 IMPLANT
SUCTION FRAZIER TIP 10 FR DISP (SUCTIONS) ×3 IMPLANT
SUT ETHIBOND NAB CT1 #1 30IN (SUTURE) IMPLANT
SUT MNCRL AB 3-0 PS2 18 (SUTURE) ×3 IMPLANT
SUT VIC AB 0 CT1 27 (SUTURE) ×6
SUT VIC AB 0 CT1 27XBRD ANBCTR (SUTURE) ×2 IMPLANT
SUT VIC AB 1 CT1 27 (SUTURE)
SUT VIC AB 1 CT1 27XBRD ANBCTR (SUTURE) IMPLANT
SUT VIC AB 2-0 CT1 27 (SUTURE) ×6
SUT VIC AB 2-0 CT1 TAPERPNT 27 (SUTURE) ×2 IMPLANT
SYR 30ML SLIP (SYRINGE) ×3 IMPLANT
TOWEL OR 17X24 6PK STRL BLUE (TOWEL DISPOSABLE) ×3 IMPLANT
TOWEL OR 17X26 10 PK STRL BLUE (TOWEL DISPOSABLE) ×3 IMPLANT
TRAY FOLEY CATH 16FR SILVER (SET/KITS/TRAYS/PACK) ×3 IMPLANT
TUBE CONNECTING 12'X1/4 (SUCTIONS) ×1
TUBE CONNECTING 12X1/4 (SUCTIONS) ×2 IMPLANT
YANKAUER SUCT BULB TIP NO VENT (SUCTIONS) ×3 IMPLANT

## 2015-07-25 NOTE — Interval H&P Note (Signed)
History and Physical Interval Note:  07/25/2015 9:18 AM  Nicole [name]  has presented today for surgery, with the diagnosis of primary localized OA left knee  The various methods of treatment have been discussed with the patient and family. After consideration of risks, benefits and other options for treatment, the patient has consented to  Procedure(s): TOTAL KNEE ARTHROPLASTY (Left) as a surgical intervention .  The patient's history has been reviewed, patient examined, no change in status, stable for surgery.  I have reviewed the patient's chart and labs.  Questions were answered to the patient's satisfaction.     [name]

## 2015-07-25 NOTE — Progress Notes (Signed)
Orthopedic Tech Progress Note Patient Details:  Nicole [name] 1956/04/30 [id number]  CPM Left Knee CPM Left Knee: On Left Knee Flexion (Degrees): 90 Left Knee Extension (Degrees): 0 Additional Comments: trapeze bar patient helper Viewed order from doctor's order list  [name] 07/25/2015, 12:50 PM

## 2015-07-25 NOTE — Anesthesia Procedure Notes (Addendum)
Procedure Name: MAC Date/Time: 07/25/2015 9:37 AM Performed by: Willeen Cass P Pre-anesthesia Checklist: Patient identified, Timeout performed, Emergency Drugs available, Suction available and Patient being monitored Patient Re-evaluated:Patient Re-evaluated prior to inductionOxygen Delivery Method: Nasal cannula Intubation Type: IV induction Dental Injury: Teeth and Oropharynx as per pre-operative assessment    Spinal Patient location during procedure: OR Staffing Anesthesiologist: Natalee Tomkiewicz Preanesthetic Checklist Completed: patient identified, surgical consent, pre-op evaluation, timeout performed, IV checked, risks and benefits discussed and monitors and equipment checked Spinal Block Patient position: sitting Prep: site prepped and draped and DuraPrep Patient monitoring: heart rate, cardiac monitor, continuous pulse ox and blood pressure Approach: midline Location: L3-4 Injection technique: single-shot Needle Needle type: Pencan  Needle gauge: 24 G Needle length: 10 cm Assessment Sensory level: T4

## 2015-07-25 NOTE — Transfer of Care (Signed)
Immediate Anesthesia Transfer of Care Note  Patient: Nicole [name]  Procedure(s) Performed: Procedure(s): TOTAL KNEE ARTHROPLASTY (Left)  Patient Location: PACU  Anesthesia Type:MAC  Level of Consciousness: awake, alert , oriented and patient cooperative  Airway & Oxygen Therapy: Patient Spontanous Breathing and Patient connected to nasal cannula oxygen  Post-op Assessment: Report given to RN and Post -op Vital signs reviewed and stable  Post vital signs: Reviewed and stable  Last Vitals:  BP 108/61 RR 12 SpO2 97% on 2L [place] HR 72  Complications: No apparent anesthesia complications

## 2015-07-25 NOTE — Progress Notes (Signed)
Utilization review completed.  

## 2015-07-25 NOTE — Op Note (Signed)
MRN:     [id number] DOB/AGE:    1956/08/24 / 59 y.o.       OPERATIVE REPORT    DATE OF PROCEDURE:  07/25/2015       PREOPERATIVE DIAGNOSIS:   Primary localized OA left knee      Estimated body mass index is 39.08 kg/(m^2) as calculated from the following:   Height as of this encounter: 5\' 7"  (1.702 m).   Weight as of this encounter: 113.201 kg (249 lb 9 oz).                                                        POSTOPERATIVE DIAGNOSIS:   same                                                                      PROCEDURE:  Procedure(s): TOTAL KNEE ARTHROPLASTY Using Depuy Attune RP implants #6 narrow Femur, #5Tibia, 32mm attune RP bearing, 32 Patella     SURGEON: Melchor Kirchgessner A    ASSISTANT:  Kirstin Shepperson PA-C   (Present and scrubbed throughout the case, critical for assistance with exposure, retraction, instrumentation, and closure.)         ANESTHESIA: Spinal with Femoral Nerve Block  DRAINS: foley, 2 medium hemovac in knee   TOURNIQUET TIME: 40JWJ   COMPLICATIONS:  None     SPECIMENS: None   INDICATIONS FOR PROCEDURE: The patient has  djd left knee, varus deformities, XR shows bone on bone arthritis. Patient has failed all conservative measures including anti-inflammatory medicines, narcotics, attempts at  exercise and weight loss, cortisone injections and viscosupplementation.  Risks and benefits of surgery have been discussed, questions answered.   DESCRIPTION OF PROCEDURE: The patient identified by armband, received  right femoral nerve block and IV antibiotics, in the holding area at [facility]. Patient taken to the operating room, appropriate anesthetic  monitors were attached General endotracheal anesthesia induced with  the patient in supine position, Foley catheter was inserted. Tourniquet  applied high to the operative thigh. Lateral post and foot positioner  applied to the table, the lower extremity was then prepped and draped  in usual sterile fashion  from the ankle to the tourniquet. Time-out procedure was performed. The limb was wrapped with an Esmarch bandage and the tourniquet inflated to 365 mmHg. We began the operation by making the anterior midline incision starting at handbreadth above the patella going over the patella 1 cm medial to and  4 cm distal to the tibial tubercle. Small bleeders in the skin and the  subcutaneous tissue identified and cauterized. Transverse retinaculum was incised and reflected medially and a medial parapatellar arthrotomy was accomplished. the patella was everted and theprepatellar fat pad resected. The superficial medial collateral  ligament was then elevated from anterior to posterior along the proximal  flare of the tibia and anterior half of the menisci resected. The knee was hyperflexed exposing bone on bone arthritis. Peripheral and notch osteophytes as well as the cruciate ligaments were then resected. We continued to  work our way around posteriorly along the proximal  tibia, and externally  rotated the tibia subluxing it out from underneath the femur. A McHale  retractor was placed through the notch and a lateral Hohmann retractor  placed, and we then drilled through the proximal tibia in line with the  axis of the tibia followed by an intramedullary guide rod and 2-degree  posterior slope cutting guide. The tibial cutting guide was pinned into place  allowing resection of 4 mm of bone medially and about 6 mm of bone  laterally because of her varus deformity. Satisfied with the tibial resection, we then  entered the distal femur 2 mm anterior to the PCL origin with the  intramedullary guide rod and applied the distal femoral cutting guide  set at 94mm, with 5 degrees of valgus. This was pinned along the  epicondylar axis. At this point, the distal femoral cut was accomplished without difficulty. We then sized for a #6 narrow femoral component and pinned the guide in 3 degrees of external rotation.The  chamfer cutting guide was pinned into place. The anterior, posterior, and chamfer cuts were accomplished without difficulty followed by  the Attune RP box cutting guide and the box cut. We also removed posterior osteophytes from the posterior femoral condyles. At this  time, the knee was brought into full extension. We checked our  extension and flexion gaps and found them symmetric at 83mm.  The patella thickness measured at 20 mm. We set the cutting guide at 13 and removed the posterior 8 mm  of the patella sized for 32 button and drilled the lollipop. The knee  was then once again hyperflexed exposing the proximal tibia. We sized for a #5 tibial base plate, applied the smokestack and the conical reamer followed by the the Delta fin keel punch. We then hammered into place the Attune RP trial femoral component, inserted a 5-mm trial bearing, trial patellar button, and took the knee through range of motion from 0-130 degrees. No thumb pressure was required for patellar  tracking. At this point, all trial components were removed, a double batch of DePuy HV cement  was mixed and applied to all bony metallic mating surfaces except for the posterior condyles of the femur itself. In order, we  hammered into place the tibial tray and removed excess cement, the femoral component and removed excess cement, a 5-mm Attune RP bearing  was inserted, and the knee brought to full extension with compression.  The patellar button was clamped into place, and excess cement  removed. While the cement cured the wound was irrigated out with normal saline solution pulse lavage, and medium Hemovac drains were placed.. Ligament stability and patellar tracking were checked and found to be excellent. The tourniquet was then released and hemostasis was obtained with cautery. The parapatellar arthrotomy was closed with  #1 ethibond suture. The subcutaneous tissue with 0 and 2-0 undyed  Vicryl suture, and 4-0 Monocryl.. A dressing  of Xeroform,  4 x 4, dressing sponges, Webril, and Ace wrap applied. Needle and sponge count were correct times 2.The patient awakened, extubated, and taken to recovery room without difficulty. Vascular status was normal, pulses 2+ and symmetric.   Adelise Buswell A 07/25/2015, 11:00 AM

## 2015-07-25 NOTE — Anesthesia Preprocedure Evaluation (Addendum)
Anesthesia Evaluation  Patient identified by MRN, date of birth, ID band Patient awake    Reviewed: Allergy & Precautions, NPO status , Patient's Chart, lab work & pertinent test results  History of Anesthesia Complications Negative for: history of anesthetic complications  Airway Mallampati: II  TM Distance: >3 FB Neck ROM: Full    Dental  (+) Teeth Intact, Dental Advisory Given   Pulmonary neg pulmonary ROS,    Pulmonary exam normal breath sounds clear to auscultation       Cardiovascular hypertension, Pt. on medications (-) angina(-) Past MI and (-) CHF Normal cardiovascular exam Rhythm:Regular Rate:Normal     Neuro/Psych negative neurological ROS  negative psych ROS   GI/Hepatic negative GI ROS, Neg liver ROS,   Endo/Other  Morbid obesity  Renal/GU negative Renal ROS     Musculoskeletal  (+) Arthritis ,   Abdominal   Peds  Hematology negative hematology ROS (+)   Anesthesia Other Findings   Reproductive/Obstetrics                           Anesthesia Physical Anesthesia Plan  ASA: III  Anesthesia Plan:    Post-op Pain Management:    Induction:   Airway Management Planned:   Additional Equipment:   Intra-op Plan:   Post-operative Plan:   Informed Consent:   Plan Discussed with:   Anesthesia Plan Comments:         Anesthesia Quick Evaluation

## 2015-07-25 NOTE — Evaluation (Signed)
Physical Therapy Evaluation Patient Details Name: Nicole [name] MRN: [id number] DOB: 03/11/1956 Today's Date: 07/25/2015   History of Present Illness  Patient is a 59 y/o female s/p left TKA. PMH includes HTN, HLD, anemia.  Clinical Impression  Patient presents with pain and post surgical LLE s/p Lt TKA. Tolerated short distance ambulation with Min A for balance/safety 2/2 to right knee instability due to nerve block. Pt will be staying with parents at d/c for 2 weeks. Instructed pt in exercises. Will follow acutely to maximize independence and mobility prior to return home.     Follow Up Recommendations Home health PT;Supervision/Assistance - 24 hour    Equipment Recommendations  None recommended by PT    Recommendations for Other Services OT consult     Precautions / Restrictions Precautions Precautions: Fall;Knee Precaution Booklet Issued: No Precaution Comments: Reviewed no pillow under knee and precautions. Restrictions Weight Bearing Restrictions: Yes RLE Weight Bearing: Weight bearing as tolerated      Mobility  Bed Mobility Overal bed mobility: Needs Assistance Bed Mobility: Supine to Sit     Supine to sit: Min guard;HOB elevated     General bed mobility comments: Cues for technique. Use of rails.  Transfers Overall transfer level: Needs assistance Equipment used: Rolling walker (2 wheeled) Transfers: Sit to/from Stand Sit to Stand: Min assist         General transfer comment: Min A to boost from EOB with initial LOB back onto bed. Cues for hand placement.  Ambulation/Gait Ambulation/Gait assistance: Min assist Ambulation Distance (Feet): 7 Feet Assistive device: Rolling walker (2 wheeled) Gait Pattern/deviations: Step-to pattern;Decreased stance time - right;Decreased step length - left;Trunk flexed   Gait velocity interpretation: Below normal speed for age/gender General Gait Details: Slow, unsteady gait secondary to right knee instability.    Stairs            Wheelchair Mobility    Modified Rankin (Stroke Patients Only)       Balance Overall balance assessment: Needs assistance Sitting-balance support: Feet supported;No upper extremity supported Sitting balance-Leahy Scale: Good     Standing balance support: During functional activity Standing balance-Leahy Scale: Poor Standing balance comment: relient on RW for support.                             Pertinent Vitals/Pain Pain Assessment: Faces Faces Pain Scale: Hurts little more Pain Location: right knee Pain Descriptors / Indicators: Sore;Aching Pain Intervention(s): Monitored during session;Premedicated before session;Repositioned    Home Living Family/patient expects to be discharged to:: Private residence Living Arrangements: Alone;Parent Available Help at Discharge: Family;Available 24 hours/day Type of Home: House Home Access: Stairs to enter   [employer] of Steps: 2 without rail vs 4 with HR Home Layout: One level Home Equipment: Walker - 2 wheels;Bedside commode      Prior Function Level of Independence: Independent         Comments: Publishing copy.     Hand Dominance        Extremity/Trunk Assessment   Upper Extremity Assessment: Defer to OT evaluation           Lower Extremity Assessment: RLE deficits/detail LLE Deficits / Details: Limited AROM/strength secondary to pain and post op.     Communication   Communication: No difficulties  Cognition Arousal/Alertness: Awake/alert Behavior During Therapy: WFL for tasks assessed/performed Overall Cognitive Status: Within Functional Limits for tasks assessed  General Comments General comments (skin integrity, edema, etc.): Parents present in room.    Exercises Total Joint Exercises Ankle Circles/Pumps: Both;10 reps;Supine Quad Sets: Both;10 reps;Supine Gluteal Sets: Both;10 reps;Supine      Assessment/Plan     PT Assessment Patient needs continued PT services  PT Diagnosis Difficulty walking;Generalized weakness;Acute pain   PT Problem List Decreased strength;Pain;Decreased range of motion;Decreased activity tolerance;Decreased balance;Impaired sensation;Decreased mobility  PT Treatment Interventions Balance training;Gait training;Functional mobility training;Therapeutic activities;Therapeutic exercise;Patient/family education;Stair training   PT Goals (Current goals can be found in the Care Plan section) Acute Rehab PT Goals Patient Stated Goal: to get better to get other knee done PT Goal Formulation: With patient Time For Goal Achievement: 08/08/15 Potential to Achieve Goals: Fair    Frequency 7X/week   Barriers to discharge        Co-evaluation               End of Session Equipment Utilized During Treatment: Gait belt Activity Tolerance: Patient tolerated treatment well Patient left: in chair;with call bell/phone within reach;with family/visitor present Nurse Communication: Mobility status         Time: 0814-4818 PT Time Calculation (min) (ACUTE ONLY): 25 min   Charges:   PT Evaluation $Initial PT Evaluation Tier I: 1 Procedure PT Treatments $Therapeutic Activity: 8-22 mins   PT G Codes:        Rayen Palen A Fabiana Dromgoole 07/25/2015, 5:02 PM Wray Kearns, [place], DPT [phone]

## 2015-07-26 ENCOUNTER — Encounter (HOSPITAL_COMMUNITY): Payer: Self-pay | Admitting: Orthopedic Surgery

## 2015-07-26 LAB — CBC
HEMATOCRIT: 32.3 % — AB (ref 36.0–46.0)
HEMOGLOBIN: 10.5 g/dL — AB (ref 12.0–15.0)
MCH: 29.7 pg (ref 26.0–34.0)
MCHC: 32.5 g/dL (ref 30.0–36.0)
MCV: 91.5 fL (ref 78.0–100.0)
Platelets: 247 10*3/uL (ref 150–400)
RBC: 3.53 MIL/uL — AB (ref 3.87–5.11)
RDW: 14.5 % (ref 11.5–15.5)
WBC: 13.1 10*3/uL — AB (ref 4.0–10.5)

## 2015-07-26 LAB — BASIC METABOLIC PANEL
ANION GAP: 8 (ref 5–15)
BUN: 18 mg/dL (ref 6–20)
CHLORIDE: 104 mmol/L (ref 101–111)
CO2: 28 mmol/L (ref 22–32)
Calcium: 8.7 mg/dL — ABNORMAL LOW (ref 8.9–10.3)
Creatinine, Ser: 1.75 mg/dL — ABNORMAL HIGH (ref 0.44–1.00)
GFR calc Af Amer: 36 mL/min — ABNORMAL LOW (ref >60)
GFR, EST NON AFRICAN AMERICAN: 31 mL/min — AB (ref >60)
GLUCOSE: 159 mg/dL — AB (ref 65–99)
POTASSIUM: 5.1 mmol/L (ref 3.5–5.1)
Sodium: 140 mmol/L (ref 135–145)

## 2015-07-26 NOTE — Progress Notes (Signed)
Physical Therapy Treatment Patient Details Name: Nicole [name] MRN: [id number] DOB: 01/11/1956 Today's Date: 07/26/2015    History of Present Illness Patient is a 59 y/o female s/p left TKA. PMH includes HTN, HLD, anemia.    PT Comments    Patient is making good progress with PT.  From a mobility standpoint anticipate patient will D/C to home with family assistance. Patient ambulating 175 feet with rw, supervision in and out of bed. Will continue with PT services to progress mobility, strength, range of motion, and overall independence.      Follow Up Recommendations  Home health PT;Supervision for mobility/OOB     Equipment Recommendations  None recommended by PT    Recommendations for Other Services       Precautions / Restrictions Precautions Precautions: Fall;Knee Precaution Booklet Issued: Yes (comment) Precaution Comments: HEP Restrictions Weight Bearing Restrictions: Yes LLE Weight Bearing: Weight bearing as tolerated    Mobility  Bed Mobility Overal bed mobility: Needs Assistance Bed Mobility: Supine to Sit;Sit to Supine     Supine to sit: Supervision Sit to supine: Min guard   General bed mobility comments: bed flat  Transfers Overall transfer level: Needs assistance Equipment used: Rolling walker (2 wheeled) Transfers: Sit to/from Stand Sit to Stand: Supervision         General transfer comment: no cues for hand placement needed.   Ambulation/Gait Ambulation/Gait assistance: Supervision Ambulation Distance (Feet): 175 Feet Assistive device: Rolling walker (2 wheeled) Gait Pattern/deviations: Step-through pattern;Decreased weight shift to left Gait velocity: decreased   General Gait Details: slow pattern, cues for knee flexion with swing phase   Stairs            Wheelchair Mobility    Modified Rankin (Stroke Patients Only)       Balance Overall balance assessment: Needs assistance Sitting-balance support: No upper extremity  supported Sitting balance-Leahy Scale: Good     Standing balance support: During functional activity Standing balance-Leahy Scale: Fair Standing balance comment: using rw                    Cognition Arousal/Alertness: Awake/alert Behavior During Therapy: WFL for tasks assessed/performed Overall Cognitive Status: Within Functional Limits for tasks assessed                      Exercises Total Joint Exercises Ankle Circles/Pumps: AROM;Both;15 reps Quad Sets: Strengthening;Left;10 reps Short Arc Quad: Strengthening;Left;10 reps Heel Slides: AAROM;Strengthening;Left Hip ABduction/ADduction: Strengthening;Left;10 reps Straight Leg Raises: Strengthening;Left;10 reps (independent) Knee Flexion: AROM;Left;5 reps Goniometric ROM: 98 degrees seated    General Comments        Pertinent Vitals/Pain Pain Assessment: 0-10 Pain Score: 1  Pain Location: Lt knee Pain Descriptors / Indicators: Sore Pain Intervention(s): Monitored during session;Limited activity within patient's tolerance;Premedicated before session    Home Living                      Prior Function            PT Goals (current goals can now be found in the care plan section) Acute Rehab PT Goals Patient Stated Goal: to go home tomorrow PT Goal Formulation: With patient Time For Goal Achievement: 08/08/15 Potential to Achieve Goals: Fair Progress towards PT goals: Progressing toward goals    Frequency  7X/week    PT Plan Current plan remains appropriate    Co-evaluation             End of  Session Equipment Utilized During Treatment: Gait belt Activity Tolerance: Patient tolerated treatment well Patient left: in bed;in CPM;with call bell/phone within reach;Other (comment) (ice on )     Time: 8329-1916 PT Time Calculation (min) (ACUTE ONLY): 27 min  Charges:  $Gait Training: 8-22 mins $Therapeutic Exercise: 8-22 mins                    G Codes:      Cassell Clement,  PT, CSCS Pager [phone] Office [phone]  07/26/2015, 3:52 PM

## 2015-07-26 NOTE — Care Management Note (Signed)
Case Management Note  Patient Details  Name: NAQUITA NAPPIER MRN: [id number] Date of Birth: [birth year]  Subjective/Objective:    59 yr old female s/p left total knee arthroplasty.                Action/Plan: Case manager spoke with patient concerning home health and DME needs at discharge. Patient was preoperatively setup with [facility], no changes. She has rolling walker and 3in1, CPM to be delivered. Ms. Gunia will be going to her mom's house for recovery: [address] [place], [place]  Phone [phone].   Expected Discharge Date:   07/27/15               Expected Discharge Plan:  [place]  In-House Referral:  NA  Discharge planning Services  CM Consult  Post Acute Care Choice:  Home Health, Durable Medical Equipment Choice offered to:  Patient  DME Arranged:  CPM DME Agency:  [employer]  HH Arranged:  PT HH Agency:  [place]  Status of Service:  Completed, signed off  Medicare Important Message Given:    Date Medicare IM Given:    Medicare IM give by:    Date Additional Medicare IM Given:    Additional Medicare Important Message give by:     If discussed at [place] of Stay Meetings, dates discussed:    Additional Comments:  Ninfa Meeker, RN 07/26/2015, 12:47 PM

## 2015-07-26 NOTE — Anesthesia Postprocedure Evaluation (Signed)
  Anesthesia Post-op Note  Patient: OSMARA DRUMMONDS  Procedure(s) Performed: Procedure(s): TOTAL KNEE ARTHROPLASTY (Left)  Patient Location: PACU  Anesthesia Type:MAC, Regional and Spinal  Level of Consciousness: awake  Airway and Oxygen Therapy: Patient Spontanous Breathing  Post-op Pain: mild  Post-op Assessment: Post-op Vital signs reviewed, Patient's Cardiovascular Status Stable, Respiratory Function Stable, Patent Airway, No signs of Nausea or vomiting and Pain level controlled LLE Motor Response: Purposeful movement, Responds to commands   RLE Motor Response: Purposeful movement, Responds to commands   L Sensory Level: S1-Sole of foot, small toes R Sensory Level: S1-Sole of foot, small toes  Post-op Vital Signs: Reviewed and stable  Last Vitals:  Filed Vitals:   07/26/15 1315  BP: 115/62  Pulse: 77  Temp: 36.8 C  Resp: 16    Complications: No apparent anesthesia complications

## 2015-07-26 NOTE — Progress Notes (Signed)
Subjective: 1 Day Post-Op Procedure(s) (LRB): TOTAL KNEE ARTHROPLASTY (Left) Patient reports pain as 5 on 0-10 scale.    Objective: Vital signs in last 24 hours: Temp:  [97.5 F (36.4 C)-98.1 F (36.7 C)] 97.5 F (36.4 C) (10/11 0526) Pulse Rate:  [58-78] 73 (10/11 0526) Resp:  [8-18] 14 (10/11 0526) BP: (90-126)/(45-77) 107/59 mmHg (10/11 0526) SpO2:  [92 %-98 %] 92 % (10/11 0526)  Intake/Output from previous day: 10/10 0701 - 10/11 0700 In: 2590 [P.O.:240; I.V.:2350] Out: 2000 [Urine:1550; Drains:450] Intake/Output this shift:     Recent Labs  07/26/15 0535  HGB 10.5*    Recent Labs  07/26/15 0535  WBC 13.1*  RBC 3.53*  HCT 32.3*  PLT 247    Recent Labs  07/26/15 0535  NA 140  K 5.1  CL 104  CO2 28  BUN 18  CREATININE 1.75*  GLUCOSE 159*  CALCIUM 8.7*   No results for input(s): LABPT, INR in the last 72 hours.  ABD soft Neurovascular intact Sensation intact distally Intact pulses distally Dorsiflexion/Plantar flexion intact Incision: dressing C/D/I  Assessment/Plan: 1 Day Post-Op Procedure(s) (LRB): TOTAL KNEE ARTHROPLASTY (Left)  Principal Problem:   Primary localized osteoarthritis of left knee Active Problems:   Abnormality of gait   Hallux rigidus of left foot   Back pain   DJD (degenerative joint disease) of knee  Advance diet Up with therapy Plan for discharge tomorrow  Nicole [name] 07/26/2015, 9:09 AM

## 2015-07-26 NOTE — Evaluation (Signed)
Occupational Therapy Evaluation Patient Details Name: AMAREE LOISEL MRN: [id number] DOB: 1956-03-05 Today's Date: 07/26/2015    History of Present Illness Patient is a 59 y/o female s/p left TKA. PMH includes HTN, HLD, anemia.   Clinical Impression   Pt reports she was independent with ADLs PTA. Currently pt is overall at a min guard level for ADLs and functional mobility except for min assist for LB ADLs. All education complete; pt verbalizes understanding. Pt to d/c home with 24/7 supervision from family. Pt ready to d/c from an OT standpoint, signing off at this time. Thank you for this referral.     Follow Up Recommendations  No OT follow up;Supervision/Assistance - 24 hour    Equipment Recommendations  3 in 1 bedside comode    Recommendations for Other Services       Precautions / Restrictions Precautions Precautions: Fall;Knee Precaution Comments: Reviewed no pillow under knee and precautions. Restrictions Weight Bearing Restrictions: Yes RLE Weight Bearing: Weight bearing as tolerated      Mobility Bed Mobility               General bed mobility comments: Pt in recliner, returned to recliner at end of session  Transfers Overall transfer level: Needs assistance Equipment used: Rolling walker (2 wheeled) Transfers: Sit to/from Stand Sit to Stand: Min guard         General transfer comment: Sit <> stand from chair x 1, toilet x 1    Balance Overall balance assessment: Needs assistance Sitting-balance support: Feet supported;No upper extremity supported Sitting balance-Leahy Scale: Good     Standing balance support: During functional activity Standing balance-Leahy Scale: Poor Standing balance comment: washed hands and brushed teeth while standing at sink                            ADL Overall ADL's : Needs assistance/impaired     Grooming: Wash/dry hands;Oral care;Supervision/safety;Standing               Lower Body  Dressing: Minimal assistance Lower Body Dressing Details (indicate cue type and reason): Pt unable to reach L foot to don/doff sock Toilet Transfer: Min guard;Ambulation;BSC;RW (BSC over toilet)   Toileting- Clothing Manipulation and Hygiene: Min guard;Sitting/lateral lean;Sit to/from stand Toileting - Clothing Manipulation Details (indicate cue type and reason): Sitting for toilet hygiene, sit <> stand for clothing manipulation Tub/ Shower Transfer: Min guard;Ambulation;Shower seat;Rolling walker   Functional mobility during ADLs: Min guard;Rolling walker General ADL Comments: No family present for OT eval. Educated pt on compensatory strategies for LB ADLs, walk in shower transfer technique, safety with RW during transfers, use of 3 in 1 over toilet; pt verbalized understanding. Discussed use of AE for independence with LB ADLs, pt declined stating that someone could help initially until she is able to do on her own. Educated pt on attempting to put on socks/shoes every day before getting assistance. Educated pt on need for supervision during ADLs and functional mobility for safety; pt verbalizes understanding.       Vision     Perception     Praxis      Pertinent Vitals/Pain Pain Assessment: 0-10 Pain Score: 7  Pain Location: R knee Pain Descriptors / Indicators: Burning;Sore Pain Intervention(s): Limited activity within patient's tolerance;Monitored during session;Repositioned;Patient requesting pain meds-RN notified;Ice applied     Hand Dominance Right   Extremity/Trunk Assessment Upper Extremity Assessment Upper Extremity Assessment: Overall WFL for tasks assessed  Lower Extremity Assessment Lower Extremity Assessment: Defer to PT evaluation       Communication Communication Communication: No difficulties   Cognition Arousal/Alertness: Awake/alert Behavior During Therapy: WFL for tasks assessed/performed Overall Cognitive Status: Within Functional Limits for tasks  assessed                     General Comments       Exercises       Shoulder Instructions      Home Living Family/patient expects to be discharged to:: Private residence Living Arrangements: Alone;Parent Available Help at Discharge: Family;Available 24 hours/day Type of Home: House Home Access: Stairs to enter [employer] of Steps: 2 without rail vs 4 with HR   Home Layout: Two level     Bathroom Shower/Tub: Occupational psychologist: Standard Bathroom Accessibility: Yes How Accessible: Accessible via walker Home Equipment: [place] - 2 wheels;Shower seat;Grab bars - tub/shower;Toilet riser   Additional Comments: Pt reports she will be d/c to her parents house for ~2 weeks before returning home      Prior Functioning/Environment Level of Independence: Independent        Comments: Publishing copy.    OT Diagnosis: Generalized weakness;Acute pain   OT Problem List:     OT Treatment/Interventions:      OT Goals(Current goals can be found in the care plan section) Acute Rehab OT Goals Patient Stated Goal: to go home tomorrow OT Goal Formulation: With patient  OT Frequency:     Barriers to D/C:            Co-evaluation              End of Session Equipment Utilized During Treatment: Gait belt;Rolling walker CPM Left Knee CPM Left Knee: Off Nurse Communication: Patient requests pain meds  Activity Tolerance: Patient tolerated treatment well Patient left: in chair;with call bell/phone within reach   Time: 7035-0093 OT Time Calculation (min): 27 min Charges:  OT General Charges $OT Visit: 1 Procedure OT Evaluation $Initial OT Evaluation Tier I: 1 Procedure OT Treatments $Self Care/Home Management : 8-22 mins G-Codes:     Binnie Kand M.S., OTR/L Pager: [phone]  07/26/2015, 11:38 AM

## 2015-07-26 NOTE — Progress Notes (Signed)
Physical Therapy Treatment Patient Details Name: Nicole [name] MRN: [id number] DOB: 02-Feb-1956 Today's Date: 07/26/2015    History of Present Illness Patient is a 59 y/o female s/p left TKA. PMH includes HTN, HLD, anemia.    PT Comments    Patient progressing with mobility at this time. Able to increase ambulation to 150 feet but pattern is slow with mild compensations. Will continue with skilled PT to progress mobility and independence. Continue to anticipate D/C to home with parents support.   Follow Up Recommendations  Home health PT;Supervision for mobility/OOB     Equipment Recommendations  None recommended by PT    Recommendations for Other Services       Precautions / Restrictions Precautions Precautions: Fall;Knee Precaution Booklet Issued: Yes (comment) Precaution Comments: HEP Restrictions Weight Bearing Restrictions: Yes RLE Weight Bearing: Weight bearing as tolerated LLE Weight Bearing: Weight bearing as tolerated    Mobility  Bed Mobility               General bed mobility comments: up in chair upon arrival  Transfers Overall transfer level: Needs assistance Equipment used: Rolling walker (2 wheeled) Transfers: Sit to/from Stand Sit to Stand: Min guard         General transfer comment: no cues for hand placement needed.   Ambulation/Gait Ambulation/Gait assistance: Supervision;Min guard Ambulation Distance (Feet): 150 Feet Assistive device: Rolling walker (2 wheeled) Gait Pattern/deviations: Decreased weight shift to left Gait velocity: decreased   General Gait Details: slow pattern, cues for knee flexion with swing phase   Stairs            Wheelchair Mobility    Modified Rankin (Stroke Patients Only)       Balance Overall balance assessment: Needs assistance Sitting-balance support: No upper extremity supported Sitting balance-Leahy Scale: Good     Standing balance support: During functional activity Standing  balance-Leahy Scale: Fair Standing balance comment: using rw                    Cognition Arousal/Alertness: Awake/alert Behavior During Therapy: WFL for tasks assessed/performed Overall Cognitive Status: Within Functional Limits for tasks assessed                      Exercises Total Joint Exercises Ankle Circles/Pumps: AROM;Both;15 reps Quad Sets: Strengthening;Left;10 reps Short Arc Quad: Strengthening;Left;10 reps Heel Slides: AAROM;Strengthening;Left Straight Leg Raises: Strengthening;Left;10 reps;Other (comment) (min assist) Knee Flexion: AROM;Left;5 reps Goniometric ROM: 98 degrees seated    General Comments        Pertinent Vitals/Pain Pain Assessment: 0-10 Pain Score: 7  Pain Location: Lt knee Pain Descriptors / Indicators: Sore Pain Intervention(s): Limited activity within patient's tolerance;Monitored during session    Home Living        Prior Function          PT Goals (current goals can now be found in the care plan section) Acute Rehab PT Goals Patient Stated Goal: to go home tomorrow PT Goal Formulation: With patient Time For Goal Achievement: 08/08/15 Potential to Achieve Goals: Fair Progress towards PT goals: Progressing toward goals    Frequency  7X/week    PT Plan Current plan remains appropriate    Co-evaluation             End of Session Equipment Utilized During Treatment: Gait belt Activity Tolerance: Patient tolerated treatment well Patient left: in chair;with call bell/phone within reach;Other (comment) (in knee extension)     Time: 4174-0814 PT Time  Calculation (min) (ACUTE ONLY): 36 min  Charges:  $Gait Training: 8-22 mins $Therapeutic Exercise: 8-22 mins                    G Codes:      Cassell Clement, PT, CSCS Pager [phone] Office [phone]  07/26/2015, 12:48 PM

## 2015-07-26 NOTE — Progress Notes (Signed)
Orthopedic Tech Progress Note Patient Details:  Nicole [name] 1956-04-19 [id number] On cpm at 7:00 pm 0-95 Patient ID: [name], female   DOB: December 12, 1955, 59 y.o.   MRN: [id number]   Braulio Bosch 07/26/2015, 7:04 PM

## 2015-07-27 LAB — BASIC METABOLIC PANEL
ANION GAP: 11 (ref 5–15)
BUN: 25 mg/dL — ABNORMAL HIGH (ref 6–20)
CO2: 23 mmol/L (ref 22–32)
Calcium: 8.7 mg/dL — ABNORMAL LOW (ref 8.9–10.3)
Chloride: 102 mmol/L (ref 101–111)
Creatinine, Ser: 1.19 mg/dL — ABNORMAL HIGH (ref 0.44–1.00)
GFR calc Af Amer: 58 mL/min — ABNORMAL LOW (ref >60)
GFR, EST NON AFRICAN AMERICAN: 50 mL/min — AB (ref >60)
GLUCOSE: 150 mg/dL — AB (ref 65–99)
POTASSIUM: 4.7 mmol/L (ref 3.5–5.1)
Sodium: 136 mmol/L (ref 135–145)

## 2015-07-27 LAB — CBC
HEMATOCRIT: 27.2 % — AB (ref 36.0–46.0)
Hemoglobin: 8.9 g/dL — ABNORMAL LOW (ref 12.0–15.0)
MCH: 29.4 pg (ref 26.0–34.0)
MCHC: 32.7 g/dL (ref 30.0–36.0)
MCV: 89.8 fL (ref 78.0–100.0)
Platelets: 207 10*3/uL (ref 150–400)
RBC: 3.03 MIL/uL — AB (ref 3.87–5.11)
RDW: 14.3 % (ref 11.5–15.5)
WBC: 12.3 10*3/uL — AB (ref 4.0–10.5)

## 2015-07-27 MED ORDER — ASPIRIN 325 MG PO TBEC: DELAYED_RELEASE_TABLET | ORAL | Status: DC

## 2015-07-27 MED ORDER — DOCUSATE SODIUM 100 MG PO CAPS: ORAL_CAPSULE | ORAL | Status: DC

## 2015-07-27 MED ORDER — POLYETHYLENE GLYCOL 3350 17 G PO PACK: PACK | ORAL | Status: DC

## 2015-07-27 MED ORDER — OXYCODONE HCL ER 20 MG PO T12A: 20.0000 mg | EXTENDED_RELEASE_TABLET | Freq: Two times a day (BID) | ORAL | Status: DC

## 2015-07-27 MED ORDER — OXYCODONE HCL 5 MG PO TABS: ORAL_TABLET | ORAL | Status: DC

## 2015-07-27 MED ORDER — CELECOXIB 200 MG PO CAPS: ORAL_CAPSULE | ORAL | Status: DC

## 2015-07-27 NOTE — Progress Notes (Signed)
Physical Therapy Treatment Patient Details Name: Nicole [name] MRN: [id number] DOB: 12/22/1955 Today's Date: 07/27/2015    History of Present Illness Patient is a 59 y/o female s/p left TKA. PMH includes HTN, HLD, anemia.    PT Comments    Patient is making good progress with PT.  From a mobility standpoint anticipate patient will be ready for DC home with support of parents. Thorough education performed regarding HEP and activity progression at home. Patient denies any questions or concerns follow session.      Follow Up Recommendations  Home health PT;Supervision for mobility/OOB     Equipment Recommendations  None recommended by PT    Recommendations for Other Services       Precautions / Restrictions Precautions Precautions: Knee Precaution Booklet Issued: Yes (comment) Precaution Comments: HEP Restrictions Weight Bearing Restrictions: Yes LLE Weight Bearing: Weight bearing as tolerated    Mobility  Bed Mobility               General bed mobility comments: up in chair upon arrival  Transfers                 General transfer comment: no performed during session  Ambulation/Gait             General Gait Details: Just returning from ambulation with Kirstin Shepperson PA-C. Reports ambulating and performing stairs and reports feeling confident with both at this time.    Stairs            Wheelchair Mobility    Modified Rankin (Stroke Patients Only)       Balance Overall balance assessment: Needs assistance Sitting-balance support: No upper extremity supported Sitting balance-Leahy Scale: Normal                              Cognition Arousal/Alertness: Awake/alert Behavior During Therapy: WFL for tasks assessed/performed Overall Cognitive Status: Within Functional Limits for tasks assessed                      Exercises Total Joint Exercises Ankle Circles/Pumps: AROM;Both;15 reps Quad Sets:  Strengthening;Left;10 reps Towel Squeeze: Strengthening;Both;10 reps Short Arc Quad: Strengthening;Left;10 reps Heel Slides: AAROM;Strengthening;Left Hip ABduction/ADduction: Strengthening;Left;10 reps Straight Leg Raises: Strengthening;Left;10 reps Long Arc Quad: Strengthening;Left;10 reps Knee Flexion: AAROM;Left;10 reps;Seated Goniometric ROM: 104 degrees flexion in sitting    General Comments        Pertinent Vitals/Pain Pain Assessment: 0-10 Pain Score: 1  Pain Location: Lt knee Pain Descriptors / Indicators: Sore Pain Intervention(s): Limited activity within patient's tolerance;Monitored during session    Home Living                      Prior Function            PT Goals (current goals can now be found in the care plan section) Acute Rehab PT Goals Patient Stated Goal: home today PT Goal Formulation: With patient Time For Goal Achievement: 08/08/15 Potential to Achieve Goals: Fair Progress towards PT goals: Progressing toward goals    Frequency  7X/week    PT Plan Current plan remains appropriate    Co-evaluation             End of Session   Activity Tolerance: Patient tolerated treatment well Patient left: in chair;with call bell/phone within reach;Other (comment) (in bone foam)     Time: 8250-5397 PT Time Calculation (min) (ACUTE ONLY): 25  min  Charges:  $Therapeutic Exercise: 23-37 mins                    G Codes:      Cassell Clement, PT, CSCS Pager [phone] Office [phone]  07/27/2015, 12:03 PM

## 2015-07-27 NOTE — Progress Notes (Signed)
Pt ready for d/c per MD. Cleared by PT/OT, equipment will be delivered to house. Discharge teaching and prescriptions given to pt and mother at bedside, all questions answered. Peripheral IV removed, belongings gathered and removed with mother. Wheeled out by [employer].  Raquel James  07/27/2015 12:33 PM

## 2015-07-27 NOTE — Discharge Summary (Signed)
Patient ID: Nicole [name] MRN: [id number] DOB/AGE: Jan 11, 1956 59 y.o.  Admit date: 07/25/2015 Discharge date: 07/27/2015  Admission Diagnoses:  Principal Problem:   Primary localized osteoarthritis of left knee Active Problems:   Abnormality of gait   Hallux rigidus of left foot   Back pain   DJD (degenerative joint disease) of knee   Discharge Diagnoses:  Same  Past Medical History  Diagnosis Date  . Arthritis   . Allergy   . Primary localized osteoarthritis of left knee   . Hypertension   . Hyperlipidemia   . Anemia     iron deficiency  . Back pain 07/25/2015    Surgeries: Procedure(s): TOTAL KNEE ARTHROPLASTY on 07/25/2015   Consultants:    Discharged Condition: Improved  Hospital Course: Nicole [name] is an 59 y.o. female who was admitted 07/25/2015 for operative treatment ofPrimary localized osteoarthritis of left knee. Patient has severe unremitting pain that affects sleep, daily activities, and work/hobbies. After pre-op clearance the patient was taken to the operating room on 07/25/2015 and underwent  Procedure(s): TOTAL KNEE ARTHROPLASTY.    Patient was given perioperative antibiotics: Anti-infectives    Start     Dose/Rate Route Frequency Ordered Stop   07/25/15 1500  ceFAZolin (ANCEF) IVPB 2 g/50 mL premix     2 g 100 mL/hr over 30 Minutes Intravenous Every 6 hours 07/25/15 1453 07/25/15 2240   07/25/15 0900  ceFAZolin (ANCEF) 3 g in dextrose 5 % 50 mL IVPB     3 g 160 mL/hr over 30 Minutes Intravenous To ShortStay Surgical 07/24/15 1535 07/25/15 0940       Patient was given sequential compression devices, early ambulation, and chemoprophylaxis to prevent DVT.  Patient benefited maximally from hospital stay and there were no complications.    Recent vital signs: Patient Vitals for the past 24 hrs:  BP Temp Temp src Pulse Resp SpO2  07/27/15 0610 125/67 mmHg 97.5 F (36.4 C) Oral 80 18 96 %  07/26/15 2100 127/63 mmHg 98.2 F (36.8 C) Oral  75 18 98 %  07/26/15 1315 115/62 mmHg 98.2 F (36.8 C) - 77 16 94 %     Recent laboratory studies:  Recent Labs  07/26/15 0535 07/27/15 0625  WBC 13.1* 12.3*  HGB 10.5* 8.9*  HCT 32.3* 27.2*  PLT 247 207  NA 140 136  K 5.1 4.7  CL 104 102  CO2 28 23  BUN 18 25*  CREATININE 1.75* 1.19*  GLUCOSE 159* 150*  CALCIUM 8.7* 8.7*     Discharge Medications:     Medication List    STOP taking these medications        HYDROcodone-acetaminophen 5-325 MG tablet  Commonly known as:  NORCO/VICODIN     meloxicam 15 MG tablet  Commonly known as:  MOBIC      TAKE these medications        aspirin 325 MG EC tablet  1 tab a day for the next 30 days to prevent blood clots     BENICAR 40 MG tablet  Generic drug:  olmesartan  Take 20 mg by mouth daily.     buPROPion 150 MG 12 hr tablet  Commonly known as:  WELLBUTRIN SR  Take 150 mg by mouth 2 (two) times daily.     celecoxib 200 MG capsule  Commonly known as:  CELEBREX  1 tab po q day with food for pain and  swelling     cetirizine 10 MG tablet  Commonly known  as:  ZYRTEC  Take 10 mg by mouth daily.     citalopram 40 MG tablet  Commonly known as:  CELEXA  Take 40 mg by mouth daily.     docusate sodium 100 MG capsule  Commonly known as:  COLACE  1 tab 2 times a day while on narcotics.  STOOL SOFTENER     gabapentin 300 MG capsule  Commonly known as:  NEURONTIN  Take 300-600 mg by mouth 2 (two) times daily. 300mg  in the morning and 600mg  at bedtime     oxyCODONE 5 MG immediate release tablet  Commonly known as:  Oxy IR/ROXICODONE  1-2 tablets every 4-6 hrs as needed for pain     OxyCODONE 20 mg T12a 12 hr tablet  Commonly known as:  OXYCONTIN  Take 1 tablet (20 mg total) by mouth every 12 (twelve) hours.     polyethylene glycol packet  Commonly known as:  MIRALAX / GLYCOLAX  17grams in 16 oz of water twice a day until bowel movement.  LAXITIVE.  Restart if two days since last bowel movement         Diagnostic Studies: No results found.  Disposition: Final discharge disposition not confirmed        Follow-up Information    Follow up with [facility].   Why:  Someone from [facility] will contact you concerning start date and time for therapy.   Contact information:   [address] [place] [address] [phone]       Follow up with Lorn Junes, MD On 08/08/2015.   Specialty:  Orthopedic Surgery   Why:  appt time 2 pm   Contact information:   [address] [place] [address] [phone]        Signed: Linda Hedges 07/27/2015, 9:34 AM

## 2015-08-15 ENCOUNTER — Encounter: Payer: Self-pay | Admitting: Physical Therapy

## 2015-08-15 ENCOUNTER — Ambulatory Visit: Payer: BC Managed Care – PPO | Attending: Orthopedic Surgery | Admitting: Physical Therapy

## 2015-08-15 DIAGNOSIS — M25562 Pain in left knee: Secondary | ICD-10-CM | POA: Insufficient documentation

## 2015-08-15 DIAGNOSIS — M25662 Stiffness of left knee, not elsewhere classified: Secondary | ICD-10-CM | POA: Insufficient documentation

## 2015-08-15 DIAGNOSIS — M7989 Other specified soft tissue disorders: Secondary | ICD-10-CM | POA: Insufficient documentation

## 2015-08-15 DIAGNOSIS — R262 Difficulty in walking, not elsewhere classified: Secondary | ICD-10-CM | POA: Diagnosis present

## 2015-08-15 NOTE — Therapy (Signed)
[address] [place], [place], [address] Phone: [phone]   Fax:  [phone]  Physical Therapy Evaluation  Patient Details  Name: Nicole [name] MRN: [id number] Date of Birth: December 08, 1955 Referring Provider: Noemi Chapel  Encounter Date: 08/15/2015      PT End of Session - 08/15/15 1621    Visit Number 1   Date for PT Re-Evaluation 10/15/15   PT Start Time 6269   PT Stop Time 1630   PT Time Calculation (min) 57 min   Activity Tolerance Patient tolerated treatment well   Behavior During Therapy [facility] for tasks assessed/performed      Past Medical History  Diagnosis Date  . Arthritis   . Allergy   . Primary localized osteoarthritis of left knee   . Hypertension   . Hyperlipidemia   . Anemia     iron deficiency  . Back pain 07/25/2015    Past Surgical History  Procedure Laterality Date  . Abdominal hysterectomy    . Spine surgery    . Gastric bypass    . Total knee arthroplasty Left 07/25/2015    Procedure: TOTAL KNEE ARTHROPLASTY;  Surgeon: Elsie Saas, MD;  Location: [facility];  Service: Orthopedics;  Laterality: Left;  . Rotator cuff repair Right 2003    There were no vitals filed for this visit.  Visit Diagnosis:  Left knee pain - Plan: PT plan of care cert/re-cert  Knee stiffness, left - Plan: PT plan of care cert/re-cert  Difficulty walking - Plan: PT plan of care cert/re-cert  Swelling of limb - Plan: PT plan of care cert/re-cert      Subjective Assessment - 08/15/15 1534    Subjective Patient underwent a left TKR on 07/25/15.  Had home PT.  She does report one set back with a "twisted" knee with walking.     Currently in Pain? Yes   Pain Score 4    Pain Location Knee   Pain Orientation Left   Pain Descriptors / Indicators Aching;Tightness   Pain Type Surgical pain   Pain Onset 1 to 4 weeks ago   Pain Frequency Constant   Aggravating Factors  standing, walking, bending 10/10   Pain Relieving Factors rest and ice 0/10   Effect of Pain on Daily Activities difficulty walking            [facility] PT Assessment - 08/15/15 0001    Assessment   Medical Diagnosis left TKR    Referring Provider [place]   Onset Date/Surgical Date 07/25/15   Prior Therapy home PT   Precautions   Precautions None   Balance Screen   Has the patient fallen in the past 6 months No   Has the patient had a decrease in activity level because of a fear of falling?  No   Is the patient reluctant to leave their home because of a fear of falling?  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 hold left leg in flexion                   [facility] Adult PT Treatment/Exercise - 08/15/15 0001    Exercises   Exercises Knee/Hip   Knee/Hip Exercises: Aerobic   Stationary Bike x 5 minutes   Nustep level 5 x 6 minutes   Knee/Hip Exercises: Machines for Strengthening   Cybex Knee Extension 5# 2x15   Cybex Knee Flexion 25# 2x15   Cybex Leg Press 20# 2x10   Modalities   Modalities Vasopneumatic   Vasopneumatic   Number Minutes Vasopneumatic  15 minutes   Vasopnuematic Location  Knee   Vasopneumatic Pressure High   Vasopneumatic Temperature  40                  PT Short Term Goals -  08/15/15 1626    PT SHORT TERM GOAL #1   Title independent with initial HEP   Time 1   Period Weeks   Status New           PT Long Term Goals - 08/15/15 1626    PT LONG TERM GOAL #1   Title understand RICE   Time 8   Period Weeks   Status New   PT LONG TERM GOAL #2   Title increase AROM to 5-120 degrees flexion   Time 8   Period Weeks   Status New   PT LONG TERM GOAL #3   Title walk all community distances without assistive device   Time 8   Period Weeks   Status New   PT LONG TERM GOAL #4   Title ascend/descend stairs step over step   Time 8   Period Weeks   Status New               Plan - 08/15/15 1622    Clinical Impression Statement Patient with a left TKR on 07/25/15.  She is doing well and wants to try to have the other knee done by the end of the year.  She is very tender posterior knee.  She has good ROM, antalgic with a bent knee on the left.     Pt will benefit from skilled therapeutic intervention in order to improve on the following deficits Abnormal gait;Decreased mobility;Decreased range of motion;Difficulty walking;Increased edema;Impaired flexibility;Pain   Rehab Potential Good   PT Frequency 3x / week   PT Duration 8 weeks   PT Treatment/Interventions Cryotherapy;Electrical Stimulation;Gait training;Stair training;Functional mobility training;Therapeutic activities;Therapeutic exercise;Balance training;Patient/family education;Manual techniques;Neuromuscular re-education   PT Next Visit Plan add exercises as tolerated.   Consulted and Agree with Plan of Care Patient         Problem List Patient Active Problem List   Diagnosis Date Noted  . Back pain 07/25/2015  . DJD (degenerative joint disease) of knee 07/25/2015  . Primary localized osteoarthritis of left knee   . Abnormality of gait 09/16/2014  . Hallux rigidus of left foot 09/16/2014  . 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

## 2015-08-17 ENCOUNTER — Ambulatory Visit: Payer: BC Managed Care – PPO | Attending: Orthopedic Surgery | Admitting: Physical Therapy

## 2015-08-17 ENCOUNTER — Encounter: Payer: Self-pay | Admitting: Physical Therapy

## 2015-08-17 DIAGNOSIS — R262 Difficulty in walking, not elsewhere classified: Secondary | ICD-10-CM | POA: Diagnosis present

## 2015-08-17 DIAGNOSIS — M7989 Other specified soft tissue disorders: Secondary | ICD-10-CM | POA: Insufficient documentation

## 2015-08-17 DIAGNOSIS — M25662 Stiffness of left knee, not elsewhere classified: Secondary | ICD-10-CM | POA: Insufficient documentation

## 2015-08-17 DIAGNOSIS — M25562 Pain in left knee: Secondary | ICD-10-CM | POA: Diagnosis present

## 2015-08-17 NOTE — Therapy (Signed)
[name], [place], [address] Phone: [phone]   Fax:  [phone]  Physical Therapy Treatment  Patient Details  Name: Nicole [name] MRN: [id number] Date of Birth: 1956/08/26 Referring Provider: Noemi Chapel  Encounter Date: 08/17/2015      PT End of Session - 08/17/15 1207    Visit Number 2   Date for PT Re-Evaluation 10/15/15   PT Start Time 1110   PT Stop Time 1201   PT Time Calculation (min) 51 min      Past Medical History  Diagnosis Date  . Arthritis   . Allergy   . Primary localized osteoarthritis of left knee   . Hypertension   . Hyperlipidemia   . Anemia     iron deficiency  . Back pain 07/25/2015    Past Surgical History  Procedure Laterality Date  . Abdominal hysterectomy    . Spine surgery    . Gastric bypass    . Total knee arthroplasty Left 07/25/2015    Procedure: TOTAL KNEE ARTHROPLASTY;  Surgeon: Elsie Saas, MD;  Location: [facility];  Service: Orthopedics;  Laterality: Left;  . Rotator cuff repair Right 2003    There were no vitals filed for this visit.  Visit Diagnosis:  Knee stiffness, left  Difficulty walking  Swelling of limb  Left knee pain      Subjective Assessment - 08/17/15 1108    Subjective Pt reports that everything is going good, off pain med's except at night.   Currently in Pain? Yes   Pain Score 3    Pain Location Knee   Pain Orientation Left   Pain Descriptors / Indicators Aching   Pain Type Surgical pain                         [facility] Adult PT Treatment/Exercise - 08/17/15 0001    Knee/Hip Exercises: Aerobic   Nustep level 5 x 6 minutes   Knee/Hip Exercises: Machines for Strengthening   Cybex Knee Extension 5# 2x15   Cybex Knee Flexion 25# 3x15   Cybex Leg Press 20# 3x10   Knee/Hip Exercises: Standing   Other Standing Knee Exercises Heel raises black bar 2x20   Knee/Hip Exercises: Seated   Sit to Sand 10  reps;without UE support   Modalities   Modalities Vasopneumatic   Vasopneumatic   Number Minutes Vasopneumatic  15 minutes   Vasopnuematic Location  Knee   Vasopneumatic Pressure High   Vasopneumatic Temperature  40   Manual Therapy   Manual Therapy Passive ROM   Passive ROM R k nee flex and ext                   PT Short Term Goals - 08/15/15 1626    PT SHORT TERM GOAL #1   Title independent with initial HEP   Time 1   Period Weeks   Status New           PT Long Term Goals - 08/17/15 1214    PT LONG TERM GOAL #2   Title increase AROM to 5-120 degrees flexion   Status Partially Met               Plan - 08/17/15 1211    Clinical Impression Statement Pt 10 minutes late for PT treatment this date. Pt tolerated gym level exercises this date without issue. ROM remains great, some pain in L hamstring. Does  fatigue easily with sit to stand intervention.   Pt will benefit from skilled therapeutic intervention in order to improve on the following deficits Abnormal gait;Decreased mobility;Decreased range of motion;Difficulty walking;Increased edema;Impaired flexibility;Pain   Rehab Potential Good   PT Frequency 3x / week   PT Duration 8 weeks   PT Treatment/Interventions Cryotherapy;Electrical Stimulation;Gait training;Stair training;Functional mobility training;Therapeutic activities;Therapeutic exercise;Balance training;Patient/family education;Manual techniques;Neuromuscular re-education   PT Next Visit Plan add exercises as tolerated.        Problem List Patient Active Problem List   Diagnosis Date Noted  . Back pain 07/25/2015  . DJD (degenerative joint disease) of knee 07/25/2015  . Primary localized osteoarthritis of left knee   . Abnormality of gait 09/16/2014  . Hallux rigidus of left foot 09/16/2014  . 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

## 2015-08-23 ENCOUNTER — Ambulatory Visit: Payer: BC Managed Care – PPO | Admitting: Physical Therapy

## 2015-08-23 ENCOUNTER — Encounter: Payer: Self-pay | Admitting: Physical Therapy

## 2015-08-23 DIAGNOSIS — R262 Difficulty in walking, not elsewhere classified: Secondary | ICD-10-CM

## 2015-08-23 DIAGNOSIS — M25662 Stiffness of left knee, not elsewhere classified: Secondary | ICD-10-CM

## 2015-08-23 DIAGNOSIS — M25562 Pain in left knee: Secondary | ICD-10-CM

## 2015-08-23 DIAGNOSIS — M7989 Other specified soft tissue disorders: Secondary | ICD-10-CM

## 2015-08-23 NOTE — Therapy (Signed)
[place], [place], [address] Phone: [phone]   Fax:  [phone]  Physical Therapy Treatment  Patient Details  Name: Nicole [name] MRN: [id number] Date of Birth: Jul 18, 1956 Referring Provider: Noemi Chapel  Encounter Date: 08/23/2015      PT End of Session - 08/23/15 1512    Visit Number 3   Date for PT Re-Evaluation 10/15/15   PT Start Time 1430   PT Stop Time 1527   PT Time Calculation (min) 57 min   Activity Tolerance Patient tolerated treatment well   Behavior During Therapy [facility] for tasks assessed/performed      Past Medical History  Diagnosis Date  . Arthritis   . Allergy   . Primary localized osteoarthritis of left knee   . Hypertension   . Hyperlipidemia   . Anemia     iron deficiency  . Back pain 07/25/2015    Past Surgical History  Procedure Laterality Date  . Abdominal hysterectomy    . Spine surgery    . Gastric bypass    . Total knee arthroplasty Left 07/25/2015    Procedure: TOTAL KNEE ARTHROPLASTY;  Surgeon: Elsie Saas, MD;  Location: [facility];  Service: Orthopedics;  Laterality: Left;  . Rotator cuff repair Right 2003    There were no vitals filed for this visit.  Visit Diagnosis:  Difficulty walking  Swelling of limb  Left knee pain  Knee stiffness, left      Subjective Assessment - 08/23/15 1431    Subjective Pt reports that her knee is doing but her body was dead after last treatment. Pt reports fatigue 2 says after last treatment    Currently in Pain? Yes   Pain Score 1    Pain Location Knee   Pain Orientation Left            [facility] PT Assessment - 08/23/15 0001    AROM   Left Knee Extension 7   Left Knee Flexion 127                     [facility] Adult PT Treatment/Exercise - 08/23/15 0001    Knee/Hip Exercises: Aerobic   Nustep level 5 x 6 minutes   Knee/Hip Exercises: Machines for Strengthening   Cybex Knee Extension 5# 2x15    Cybex Knee Flexion 25# 2x15   Cybex Leg Press 30# 2x15   Knee/Hip Exercises: Standing   Other Standing Knee Exercises Heel raises blaxk bar 2x20   Other Standing Knee Exercises Seated TKE blue Tband 2x10   Knee/Hip Exercises: Seated   Sit to Sand without UE support;5 reps;3 sets  elevated surface    Modalities   Modalities Vasopneumatic   Vasopneumatic   Number Minutes Vasopneumatic  15 minutes   Vasopnuematic Location  Knee   Vasopneumatic Pressure High   Vasopneumatic Temperature  40   Manual Therapy   Manual Therapy Passive ROM;Soft tissue mobilization   Soft tissue mobilization L hanstring    Passive ROM R knee flex and ext                   PT Short Term Goals - 08/15/15 1626    PT SHORT TERM GOAL #1   Title independent with initial HEP   Time 1   Period Weeks   Status New           PT Long Term Goals - 08/23/15 1514    PT  LONG TERM GOAL #2   Title increase AROM to 5-120 degrees flexion   Status Partially Met               Plan - 08/23/15 1513    Clinical Impression Statement Pt tolerated all interventions well this date. Pt reports that her body was tired a few days after last PT session so the intensities of some interventions were decreased. Pt L knee ROM remains great.    Pt will benefit from skilled therapeutic intervention in order to improve on the following deficits Abnormal gait;Decreased mobility;Decreased range of motion;Difficulty walking;Increased edema;Impaired flexibility;Pain   Rehab Potential Good   PT Frequency 3x / week   PT Duration 8 weeks   PT Treatment/Interventions Cryotherapy;Electrical Stimulation;Gait training;Stair training;Functional mobility training;Therapeutic activities;Therapeutic exercise;Balance training;Patient/family education;Manual techniques;Neuromuscular re-education   PT Next Visit Plan add exercises as tolerated.        Problem List Patient Active Problem List   Diagnosis Date Noted  . Back  pain 07/25/2015  . DJD (degenerative joint disease) of knee 07/25/2015  . Primary localized osteoarthritis of left knee   . Abnormality of gait 09/16/2014  . Hallux rigidus of left foot 09/16/2014  . 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

## 2015-08-26 ENCOUNTER — Encounter: Payer: Self-pay | Admitting: Physical Therapy

## 2015-08-26 ENCOUNTER — Ambulatory Visit: Payer: BC Managed Care – PPO | Admitting: Physical Therapy

## 2015-08-26 DIAGNOSIS — M25662 Stiffness of left knee, not elsewhere classified: Secondary | ICD-10-CM

## 2015-08-26 DIAGNOSIS — M25562 Pain in left knee: Secondary | ICD-10-CM

## 2015-08-26 DIAGNOSIS — M7989 Other specified soft tissue disorders: Secondary | ICD-10-CM

## 2015-08-26 DIAGNOSIS — R262 Difficulty in walking, not elsewhere classified: Secondary | ICD-10-CM

## 2015-08-26 NOTE — Therapy (Addendum)
[place], [place], [address] Phone: [phone]   Fax:  [phone]  Physical Therapy Treatment  Patient Details  Name: Nicole [name] MRN: [id number] Date of Birth: 25-Jul-1956 Referring Provider: Noemi Chapel  Encounter Date: 08/26/2015      PT End of Session - 08/26/15 1133    Visit Number 4   PT Start Time 1055   PT Stop Time 2706   PT Time Calculation (min) 53 min      Past Medical History  Diagnosis Date  . Arthritis   . Allergy   . Primary localized osteoarthritis of left knee   . Hypertension   . Hyperlipidemia   . Anemia     iron deficiency  . Back pain 07/25/2015    Past Surgical History  Procedure Laterality Date  . Abdominal hysterectomy    . Spine surgery    . Gastric bypass    . Total knee arthroplasty Left 07/25/2015    Procedure: TOTAL KNEE ARTHROPLASTY;  Surgeon: Elsie Saas, MD;  Location: [facility];  Service: Orthopedics;  Laterality: Left;  . Rotator cuff repair Right 2003    There were no vitals filed for this visit.  Visit Diagnosis:  Difficulty walking  Swelling of limb  Left knee pain  Knee stiffness, left      Subjective Assessment - 08/26/15 1057    Subjective Pt reports that her knee is about the same as last time. She reports that her tight hamstrings are pulling on the back of her leg when she walks and that is bothering her the most .   Currently in Pain? Yes   Pain Score 1    Pain Location Knee   Pain Orientation Left   Pain Descriptors / Indicators Aching;Tightness            [facility] PT Assessment - 08/26/15 0001    AROM   AROM Assessment Site Knee   Left Knee Extension 6   Left Knee Flexion 128                     [facility] Adult PT Treatment/Exercise - 08/26/15 0001    Knee/Hip Exercises: Stretches   Gastroc Stretch Both;2 reps;30 seconds   Knee/Hip Exercises: Aerobic   Nustep level 5 x 6 minutes   Knee/Hip Exercises:  Machines for Strengthening   Cybex Knee Extension 5# 2x15  Up 2 down with left    Cybex Knee Flexion 25# 2x15   Cybex Leg Press 30# 2x15   Knee/Hip Exercises: Standing   Heel Raises 2 sets;10 reps   Knee/Hip Exercises: Seated   Sit to Sand without UE support;5 reps;3 sets  elevated surface 10 with, 10 without    Modalities   Modalities Cryotherapy   Cryotherapy   Number Minutes Cryotherapy 15 Minutes   Cryotherapy Location Knee  left   Type of Cryotherapy Ice pack   Manual Therapy   Manual Therapy Passive ROM;Soft tissue mobilization   Soft tissue mobilization L hanstring    Passive ROM R knee flex and ext                   PT Short Term Goals - 08/26/15 1135    PT SHORT TERM GOAL #1   Title independent with initial HEP   Status Achieved           PT Long Term Goals - 08/26/15 1135    PT LONG TERM  GOAL #1   Title understand RICE   Status Achieved   PT LONG TERM GOAL #2   Title increase AROM to 5-120 degrees flexion   Baseline 7-128   Status Partially Met   PT LONG TERM GOAL #3   Title walk all community distances without assistive device   Status On-going   PT LONG TERM GOAL #4   Title ascend/descend stairs step over step   Status On-going               Plan - 08/26/15 1134    Clinical Impression Statement Pt able to tolerate all exercises, slightly challenged with sit to stands without elevated surface but able to complete. Pt is still limted in knee extension and complains of tight hamstrings and cathcing in the L knee with extension.    PT Next Visit Plan Work on TKE and add balance exercises.         Problem List Patient Active Problem List   Diagnosis Date Noted  . Back pain 07/25/2015  . DJD (degenerative joint disease) of knee 07/25/2015  . Primary localized osteoarthritis of left knee   . Abnormality of gait 09/16/2014  . Hallux rigidus of left foot 09/16/2014  . 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  Patient goals were not met. Patient is being discharged due to not returning since the last visit.  ?????     Karren Burly 08/26/2015, 11:41 AM  [place], [place], [address] Phone: [phone]   Fax:  [phone]  Name: Nicole [name] MRN: [id number] Date of Birth: 08-27-1956

## 2015-08-29 ENCOUNTER — Encounter: Payer: Self-pay | Admitting: Physical Therapy

## 2015-08-29 ENCOUNTER — Ambulatory Visit: Payer: BC Managed Care – PPO | Admitting: Physical Therapy

## 2015-08-29 DIAGNOSIS — M25662 Stiffness of left knee, not elsewhere classified: Secondary | ICD-10-CM

## 2015-08-29 DIAGNOSIS — M7989 Other specified soft tissue disorders: Secondary | ICD-10-CM

## 2015-08-29 DIAGNOSIS — R262 Difficulty in walking, not elsewhere classified: Secondary | ICD-10-CM

## 2015-08-29 DIAGNOSIS — M25562 Pain in left knee: Secondary | ICD-10-CM

## 2015-08-29 NOTE — Therapy (Signed)
[facility] [address] [place], [place], [address] Phone: [phone]   Fax:  [phone]  Physical Therapy Treatment  Patient Details  Name: Nicole [name] MRN: [id number] Date of Birth: 11/20/1955 Referring Provider: Wainer  Encounter Date: 08/29/2015      PT End of Session - 08/29/15 1224    Visit Number 5   Date for PT Re-Evaluation 10/15/15   PT Start Time 1146   PT Stop Time 1225   PT Time Calculation (min) 39 min   Activity Tolerance Patient tolerated treatment well   Behavior During Therapy WFL for tasks assessed/performed      Past Medical History  Diagnosis Date  . Arthritis   . Allergy   . Primary localized osteoarthritis of left knee   . Hypertension   . Hyperlipidemia   . Anemia     iron deficiency  . Back pain 07/25/2015    Past Surgical History  Procedure Laterality Date  . Abdominal hysterectomy    . Spine surgery    . Gastric bypass    . Total knee arthroplasty Left 07/25/2015    Procedure: TOTAL KNEE ARTHROPLASTY;  Surgeon: Robert Wainer, MD;  [facility];  Service: Orthopedics;  Laterality: Left;  . Rotator cuff repair Right 2003    There were no vitals filed for this visit.  Visit Diagnosis:  Swelling of limb  Left knee pain  Knee stiffness, left  Difficulty walking      Subjective Assessment - 08/29/15 1147    Subjective Pt reports that he knee is doing real well tring to keep it stretched a little more. L knee has woken her up two nights due to position. Walked with her daughter saturday shopping.    Currently in Pain? No/denies   Pain Score 0-No pain                         [facility] Adult PT Treatment/Exercise - 08/29/15 0001    Knee/Hip Exercises: Aerobic   Stationary Bike x3/ L2x3    Knee/Hip Exercises: Machines for Strengthening   Cybex Knee Extension 5# 2x10  up bilat LE down LLE    Cybex Knee Flexion 25# 2x15   Cybex Leg Press 40#  2x10, X 10 LLE only no weight    Knee/Hip Exercises: Standing   Other Standing Knee Exercises Heel raises black bar 2x20; Standing march on  airex    Other Standing Knee Exercises Seated TKE black Tband x15   Knee/Hip Exercises: Seated   Sit to Sand without UE support;5 reps;3 sets                  PT Short Term Goals - 08/26/15 1135    PT SHORT TERM GOAL #1   Title independent with initial HEP   Status Achieved           PT Long Term Goals - 08/26/15 1135    PT LONG TERM GOAL #1   Title understand RICE   Status Achieved   PT LONG TERM GOAL #2   Title increase AROM to 5-120 degrees flexion   Baseline 7-128   Status Partially Met   PT LONG TERM GOAL #3   Title walk all community distances without assistive device   Status On-going   PT LONG TERM GOAL #4   Title ascend/descend stairs step over step   Status On-going                 Problem List Patient Active Problem List   Diagnosis Date Noted  . Back pain 07/25/2015  . DJD (degenerative joint disease) of knee 07/25/2015  . Primary localized osteoarthritis of left knee   . Abnormality of gait 09/16/2014  . Hallux rigidus of left foot 09/16/2014  . 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 [place], [place], [address] Phone: [phone]   Fax:  [phone]  Name: Nicole [name] MRN: [id number] Date of Birth: 07/07/1956     

## 2015-09-01 ENCOUNTER — Ambulatory Visit: Payer: BC Managed Care – PPO | Admitting: Physical Therapy

## 2015-10-16 HISTORY — PX: UPPER GASTROINTESTINAL ENDOSCOPY: SHX188

## 2015-10-21 ENCOUNTER — Ambulatory Visit (INDEPENDENT_AMBULATORY_CARE_PROVIDER_SITE_OTHER): Payer: BC Managed Care – PPO | Admitting: Emergency Medicine

## 2015-10-21 VITALS — BP 128/82 | HR 79 | Temp 98.1°F | Resp 18 | Ht 67.75 in | Wt 252.4 lb

## 2015-10-21 DIAGNOSIS — S8011XA Contusion of right lower leg, initial encounter: Secondary | ICD-10-CM | POA: Diagnosis not present

## 2015-10-21 DIAGNOSIS — L03115 Cellulitis of right lower limb: Secondary | ICD-10-CM | POA: Diagnosis not present

## 2015-10-21 LAB — POCT CBC
GRANULOCYTE PERCENT: 64.3 % (ref 37–80)
HEMATOCRIT: 33.7 % — AB (ref 37.7–47.9)
HEMOGLOBIN: 11.5 g/dL — AB (ref 12.2–16.2)
Lymph, poc: 1.9 (ref 0.6–3.4)
MCH, POC: 27.5 pg (ref 27–31.2)
MCHC: 34 g/dL (ref 31.8–35.4)
MCV: 80.8 fL (ref 80–97)
MID (cbc): 0.7 (ref 0–0.9)
MPV: 7.5 fL (ref 0–99.8)
PLATELET COUNT, POC: 265 10*3/uL (ref 142–424)
POC Granulocyte: 4.8 (ref 2–6.9)
POC LYMPH PERCENT: 25.8 %L (ref 10–50)
POC MID %: 9.9 %M (ref 0–12)
RBC: 4.17 M/uL (ref 4.04–5.48)
RDW, POC: 13.8 %
WBC: 7.4 10*3/uL (ref 4.6–10.2)

## 2015-10-21 MED ORDER — AMOXICILLIN-POT CLAVULANATE 875-125 MG PO TABS: 1.0000 | ORAL_TABLET | Freq: Two times a day (BID) | ORAL | Status: DC

## 2015-10-21 NOTE — Progress Notes (Signed)
By signing my name below, I, Raven Small, attest that this documentation has been prepared under the direction and in the presence of Arlyss Queen, MD.  Electronically Signed: Thea Alken, ED Scribe. 10/21/2015. 9:38 AM.  Chief Complaint:  Chief Complaint  Patient presents with  . Cellulitis    Possible cellulitis on right leg. x1 week    HPI: Nicole [name] is a 60 y.o. female who reports to [facility] today complaining of possible cellulitis. States she scrapped her right lower leg on a table 10 days ago. She's had gradual soreness and bruising to right lower leg with redness and warmth that appeared this morning.  Pt states she has been hospitalized for cellulitis in the past after developing fever, chills and nausea. Pt works as a Education officer, museum.   Past Medical History  Diagnosis Date  . Arthritis   . Allergy   . Primary localized osteoarthritis of left knee   . Hypertension   . Hyperlipidemia   . Anemia     iron deficiency  . Back pain 07/25/2015   Past Surgical History  Procedure Laterality Date  . Abdominal hysterectomy    . Spine surgery    . Gastric bypass    . Total knee arthroplasty Left 07/25/2015    Procedure: TOTAL KNEE ARTHROPLASTY;  Surgeon: Elsie Saas, MD;  Location: [facility];  Service: Orthopedics;  Laterality: Left;  . Rotator cuff repair Right 2003   Social History   Social History  . Marital Status: Married    Spouse Name: N/A  . Number of Children: N/A  . Years of Education: N/A   Social History Main Topics  . Smoking status: Never Smoker   . Smokeless tobacco: Never Used  . Alcohol Use: No  . Drug Use: No  . Sexual Activity: Not Asked   Other Topics Concern  . None   Social History Narrative   History reviewed. No pertinent family history. Allergies  Allergen Reactions  . Codeine   . Contrast Media [Iodinated Diagnostic Agents] Hives   Prior to Admission medications   Medication Sig Start Date End Date Taking? Authorizing Provider    aspirin EC 325 MG EC tablet 1 tab a day for the next 30 days to prevent blood clots 07/27/15  Yes Kirstin Shepperson, PA-C  BENICAR 40 MG tablet Take 20 mg by mouth daily. 06/18/15  Yes Historical Provider, MD  buPROPion (WELLBUTRIN SR) 150 MG 12 hr tablet Take 150 mg by mouth 2 (two) times daily.   Yes Historical Provider, MD  cetirizine (ZYRTEC) 10 MG tablet Take 10 mg by mouth daily.   Yes Historical Provider, MD  citalopram (CELEXA) 40 MG tablet Take 40 mg by mouth daily. 06/23/15  Yes Historical Provider, MD  gabapentin (NEURONTIN) 300 MG capsule Take 300-600 mg by mouth 2 (two) times daily. 300mg  in the morning and 600mg  at bedtime 04/17/15  Yes Historical Provider, MD  meloxicam (MOBIC) 7.5 MG tablet Take 7.5 mg by mouth daily.   Yes Historical Provider, MD  celecoxib (CELEBREX) 200 MG capsule 1 tab po q day with food for pain and  swelling Patient not taking: Reported on 10/21/2015 07/27/15   Kirstin Shepperson, PA-C  docusate sodium (COLACE) 100 MG capsule 1 tab 2 times a day while on narcotics.  STOOL SOFTENER Patient not taking: Reported on 10/21/2015 07/27/15   Kirstin Shepperson, PA-C  oxyCODONE (OXY IR/ROXICODONE) 5 MG immediate release tablet 1-2 tablets every 4-6 hrs as needed for pain Patient not  taking: Reported on 10/21/2015 07/27/15   Kirstin Shepperson, PA-C  OxyCODONE (OXYCONTIN) 20 mg T12A 12 hr tablet Take 1 tablet (20 mg total) by mouth every 12 (twelve) hours. Patient not taking: Reported on 10/21/2015 07/27/15   Kirstin Shepperson, PA-C  polyethylene glycol (MIRALAX / GLYCOLAX) packet 17grams in 16 oz of water twice a day until bowel movement.  LAXITIVE.  Restart if two days since last bowel movement Patient not taking: Reported on 10/21/2015 07/27/15   Kirstin Shepperson, PA-C    ROS: The patient denies fevers, chills, night sweats, unintentional weight loss, chest pain, palpitations, wheezing, dyspnea on exertion, nausea, vomiting, abdominal pain, dysuria, hematuria, melena,  numbness, weakness, or tingling.   All other systems have been reviewed and were otherwise negative with the exception of those mentioned in the HPI and as above.    PHYSICAL EXAM: Filed Vitals:   10/21/15 0922  BP: 128/82  Pulse: 79  Temp: 98.1 F (36.7 C)  Resp: 18   Body mass index is 38.65 kg/(m^2).   General: Alert, no acute distress HEENT:  Normocephalic, atraumatic, oropharynx patent. Eye: Juliette Mangle [facility] Cardiovascular:  Regular rate and rhythm, no rubs murmurs or gallops.  No Carotid bruits, radial pulse intact. No pedal edema.  Respiratory: Clear to auscultation bilaterally.  No wheezes, rales, or rhonchi.  No cyanosis, no use of accessory musculature Abdominal: No organomegaly, abdomen is soft and non-tender, positive bowel sounds.  No masses. Musculoskeletal: Gait intact. No edema, tenderness Skin: No rashes. Hematoma over the right shin. The lower portion of the hematoma is warm and slightly red and tender to touch. Pulse 2 +. No ankle swelling.  Neurologic: Facial musculature symmetric. Psychiatric: Patient acts appropriately throughout our interaction. Lymphatic: No cervical or submandibular lymphadenopathy  LABS:  Results for orders placed or performed in visit on 10/21/15  POCT CBC  Result Value Ref Range   WBC 7.4 4.6 - 10.2 K/uL   Lymph, poc 1.9 0.6 - 3.4   POC LYMPH PERCENT 25.8 10 - 50 %L   MID (cbc) 0.7 0 - 0.9   POC MID % 9.9 0 - 12 %M   POC Granulocyte 4.8 2 - 6.9   Granulocyte percent 64.3 37 - 80 %G   RBC 4.17 4.04 - 5.48 M/uL   Hemoglobin 11.5 (A) 12.2 - 16.2 g/dL   HCT, POC 33.7 (A) 37.7 - 47.9 %   MCV 80.8 80 - 97 fL   MCH, POC 27.5 27 - 31.2 pg   MCHC 34.0 31.8 - 35.4 g/dL   RDW, POC 13.8 %   Platelet Count, POC 265 142 - 424 K/uL   MPV 7.5 0 - 99.8 fL    ASSESSMENT/PLAN: This certainly could be an early cellulitis of the right leg. She was hospitalized once previously with severe cellulitis. She does have a hematoma in this area. Will  treat with Augmentin and elevation recheck 48-72 hours if not improving.I personally performed the services described in this documentation, which was scribed in my presence. The recorded information has been reviewed and is accurate.   Gross sideeffects, risk and benefits, and alternatives of medications d/w patient. Patient is aware that all medications have potential sideeffects and we are unable to predict every sideeffect or drug-drug interaction that may occur.  Arlyss Queen MD 10/21/2015 9:38 AM

## 2015-10-21 NOTE — Patient Instructions (Signed)

## 2015-11-13 ENCOUNTER — Observation Stay (HOSPITAL_BASED_OUTPATIENT_CLINIC_OR_DEPARTMENT_OTHER)
Admission: EM | Admit: 2015-11-13 | Discharge: 2015-11-15 | Disposition: A | Discharge status: Home / Self Care | Payer: BC Managed Care – PPO | Attending: Internal Medicine | Admitting: Internal Medicine

## 2015-11-13 ENCOUNTER — Encounter (HOSPITAL_BASED_OUTPATIENT_CLINIC_OR_DEPARTMENT_OTHER): Payer: Self-pay | Admitting: Emergency Medicine

## 2015-11-13 ENCOUNTER — Emergency Department (HOSPITAL_BASED_OUTPATIENT_CLINIC_OR_DEPARTMENT_OTHER): Payer: BC Managed Care – PPO

## 2015-11-13 DIAGNOSIS — M1712 Unilateral primary osteoarthritis, left knee: Secondary | ICD-10-CM | POA: Diagnosis not present

## 2015-11-13 DIAGNOSIS — Z791 Long term (current) use of non-steroidal anti-inflammatories (NSAID): Secondary | ICD-10-CM | POA: Diagnosis not present

## 2015-11-13 DIAGNOSIS — R531 Weakness: Secondary | ICD-10-CM | POA: Insufficient documentation

## 2015-11-13 DIAGNOSIS — F329 Major depressive disorder, single episode, unspecified: Secondary | ICD-10-CM | POA: Insufficient documentation

## 2015-11-13 DIAGNOSIS — Z8249 Family history of ischemic heart disease and other diseases of the circulatory system: Secondary | ICD-10-CM | POA: Diagnosis not present

## 2015-11-13 DIAGNOSIS — Z6838 Body mass index (BMI) 38.0-38.9, adult: Secondary | ICD-10-CM | POA: Insufficient documentation

## 2015-11-13 DIAGNOSIS — R0789 Other chest pain: Secondary | ICD-10-CM | POA: Diagnosis not present

## 2015-11-13 DIAGNOSIS — R072 Precordial pain: Secondary | ICD-10-CM | POA: Diagnosis not present

## 2015-11-13 DIAGNOSIS — Z96652 Presence of left artificial knee joint: Secondary | ICD-10-CM | POA: Diagnosis not present

## 2015-11-13 DIAGNOSIS — I10 Essential (primary) hypertension: Secondary | ICD-10-CM | POA: Diagnosis present

## 2015-11-13 DIAGNOSIS — Z87891 Personal history of nicotine dependence: Secondary | ICD-10-CM | POA: Diagnosis not present

## 2015-11-13 DIAGNOSIS — E785 Hyperlipidemia, unspecified: Secondary | ICD-10-CM | POA: Diagnosis present

## 2015-11-13 DIAGNOSIS — I214 Non-ST elevation (NSTEMI) myocardial infarction: Secondary | ICD-10-CM | POA: Diagnosis not present

## 2015-11-13 DIAGNOSIS — D509 Iron deficiency anemia, unspecified: Secondary | ICD-10-CM | POA: Insufficient documentation

## 2015-11-13 DIAGNOSIS — R0602 Shortness of breath: Secondary | ICD-10-CM | POA: Diagnosis not present

## 2015-11-13 DIAGNOSIS — Z9884 Bariatric surgery status: Secondary | ICD-10-CM | POA: Insufficient documentation

## 2015-11-13 DIAGNOSIS — R079 Chest pain, unspecified: Secondary | ICD-10-CM

## 2015-11-13 DIAGNOSIS — Z79899 Other long term (current) drug therapy: Secondary | ICD-10-CM | POA: Insufficient documentation

## 2015-11-13 DIAGNOSIS — K219 Gastro-esophageal reflux disease without esophagitis: Secondary | ICD-10-CM | POA: Diagnosis not present

## 2015-11-13 DIAGNOSIS — E669 Obesity, unspecified: Secondary | ICD-10-CM | POA: Insufficient documentation

## 2015-11-13 DIAGNOSIS — Z7982 Long term (current) use of aspirin: Secondary | ICD-10-CM | POA: Insufficient documentation

## 2015-11-13 DIAGNOSIS — R112 Nausea with vomiting, unspecified: Secondary | ICD-10-CM | POA: Diagnosis not present

## 2015-11-13 LAB — BASIC METABOLIC PANEL
ANION GAP: 9 (ref 5–15)
BUN: 15 mg/dL (ref 6–20)
CALCIUM: 8.6 mg/dL — AB (ref 8.9–10.3)
CO2: 23 mmol/L (ref 22–32)
Chloride: 105 mmol/L (ref 101–111)
Creatinine, Ser: 0.92 mg/dL (ref 0.44–1.00)
GFR calc Af Amer: >60 mL/min (ref >60)
GFR calc non Af Amer: >60 mL/min (ref >60)
GLUCOSE: 120 mg/dL — AB (ref 65–99)
Potassium: 4.1 mmol/L (ref 3.5–5.1)
Sodium: 137 mmol/L (ref 135–145)

## 2015-11-13 LAB — CBC
HCT: 36.8 % (ref 36.0–46.0)
HEMOGLOBIN: 11.4 g/dL — AB (ref 12.0–15.0)
MCH: 26.2 pg (ref 26.0–34.0)
MCHC: 31 g/dL (ref 30.0–36.0)
MCV: 84.6 fL (ref 78.0–100.0)
Platelets: 320 10*3/uL (ref 150–400)
RBC: 4.35 MIL/uL (ref 3.87–5.11)
RDW: 14.5 % (ref 11.5–15.5)
WBC: 9.6 10*3/uL (ref 4.0–10.5)

## 2015-11-13 LAB — TROPONIN I: Troponin I: 0.39 ng/mL — ABNORMAL HIGH (ref <0.031)

## 2015-11-13 MED ORDER — ENOXAPARIN SODIUM 40 MG/0.4ML ~~LOC~~ SOLN
40.0000 mg | SUBCUTANEOUS | Status: DC
  Administered 2015-11-13: 40 mg via SUBCUTANEOUS
  Filled 2015-11-13: qty 0.4

## 2015-11-13 MED ORDER — GABAPENTIN 300 MG PO CAPS
300.0000 mg | ORAL_CAPSULE | Freq: Every day | ORAL | Status: DC
  Administered 2015-11-13 – 2015-11-14 (×2): 300 mg via ORAL
  Filled 2015-11-13 (×2): qty 1

## 2015-11-13 MED ORDER — ACETAMINOPHEN 325 MG PO TABS
650.0000 mg | ORAL_TABLET | Freq: Four times a day (QID) | ORAL | Status: DC | PRN
  Administered 2015-11-14: 650 mg via ORAL
  Filled 2015-11-13: qty 2

## 2015-11-13 MED ORDER — ONDANSETRON HCL 4 MG/2ML IJ SOLN: 4.0000 mg | Freq: Four times a day (QID) | INTRAMUSCULAR | Status: DC | PRN

## 2015-11-13 MED ORDER — NITROGLYCERIN 2 % TD OINT
1.0000 [in_us] | TOPICAL_OINTMENT | Freq: Four times a day (QID) | TRANSDERMAL | Status: DC
  Administered 2015-11-13: 1 [in_us] via TOPICAL
  Filled 2015-11-13: qty 1

## 2015-11-13 MED ORDER — IRBESARTAN 75 MG PO TABS
37.5000 mg | ORAL_TABLET | Freq: Every day | ORAL | Status: DC
  Administered 2015-11-14 – 2015-11-15 (×2): 37.5 mg via ORAL
  Filled 2015-11-13 (×2): qty 1

## 2015-11-13 MED ORDER — ONDANSETRON HCL 4 MG PO TABS: 4.0000 mg | ORAL_TABLET | Freq: Four times a day (QID) | ORAL | Status: DC | PRN

## 2015-11-13 MED ORDER — BUPROPION HCL ER (SR) 150 MG PO TB12: 150.0000 mg | ORAL_TABLET | Freq: Two times a day (BID) | ORAL | Status: DC

## 2015-11-13 MED ORDER — CITALOPRAM HYDROBROMIDE 20 MG PO TABS
40.0000 mg | ORAL_TABLET | Freq: Every day | ORAL | Status: DC
  Administered 2015-11-14 – 2015-11-15 (×2): 40 mg via ORAL
  Filled 2015-11-13: qty 4
  Filled 2015-11-13: qty 2

## 2015-11-13 MED ORDER — BUPROPION HCL ER (XL) 150 MG PO TB24
150.0000 mg | ORAL_TABLET | Freq: Every day | ORAL | Status: DC
  Administered 2015-11-14 – 2015-11-15 (×2): 150 mg via ORAL
  Filled 2015-11-13 (×2): qty 1

## 2015-11-13 MED ORDER — ASPIRIN EC 325 MG PO TBEC
325.0000 mg | DELAYED_RELEASE_TABLET | Freq: Every day | ORAL | Status: DC
  Administered 2015-11-14: 325 mg via ORAL
  Filled 2015-11-13: qty 1

## 2015-11-13 MED ORDER — ASPIRIN 81 MG PO CHEW
324.0000 mg | CHEWABLE_TABLET | Freq: Once | ORAL | Status: AC
Start: 2015-11-13 — End: 2015-11-13
  Administered 2015-11-13: 324 mg via ORAL
  Filled 2015-11-13: qty 4

## 2015-11-13 MED ORDER — ACETAMINOPHEN 650 MG RE SUPP
650.0000 mg | Freq: Four times a day (QID) | RECTAL | Status: DC | PRN
Start: 2015-11-13 — End: 2015-11-14

## 2015-11-13 NOTE — ED Provider Notes (Signed)
CSN: [id number]     Arrival date & time 11/13/15  1244 History   First MD Initiated Contact with Patient 11/13/15 1302     Chief Complaint  Patient presents with  . Chest Pain     (Consider location/radiation/quality/duration/timing/severity/associated sxs/prior Treatment) HPI Patient developed anterior chest pain rating to both elbows onset 1 PM today. Pain is intermittent and last anywhere from 2-7 minutes. Associated symptoms include nausea and shortness of breath nothing makes symptoms better or worse she's never had similar symptoms before. No treatment prior to coming here. Past Medical History  Diagnosis Date  . Arthritis   . Allergy   . Primary localized osteoarthritis of left knee   . Hypertension   . Hyperlipidemia   . Anemia     iron deficiency  . Back pain 07/25/2015   Past Surgical History  Procedure Laterality Date  . Abdominal hysterectomy    . Spine surgery    . Gastric bypass    . Total knee arthroplasty Left 07/25/2015    Procedure: TOTAL KNEE ARTHROPLASTY;  Surgeon: Elsie Saas, MD;  Location: [facility];  Service: Orthopedics;  Laterality: Left;  . Rotator cuff repair Right 2003   No family history on file. family history father had MI age 24 Social History  Substance Use Topics  . Smoking status: Never Smoker   . Smokeless tobacco: Never Used  . Alcohol Use: No   OB History    No data available     Review of Systems  Constitutional: Negative.   HENT: Negative.   Respiratory: Positive for shortness of breath.   Cardiovascular: Positive for chest pain.  Gastrointestinal: Positive for nausea.  Musculoskeletal: Negative.   Skin: Negative.   Neurological: Negative.   Psychiatric/Behavioral: Negative.   All other systems reviewed and are negative.     Allergies  Codeine and Contrast media  Home Medications   Prior to Admission medications   Medication Sig Start Date End Date Taking? Authorizing Provider  amoxicillin-clavulanate (AUGMENTIN)  875-125 MG tablet Take 1 tablet by mouth 2 (two) times daily. 10/21/15   Darlyne Russian, MD  aspirin EC 325 MG EC tablet 1 tab a day for the next 30 days to prevent blood clots 07/27/15   Kirstin Shepperson, PA-C  BENICAR 40 MG tablet Take 20 mg by mouth daily. 06/18/15   Historical Provider, MD  buPROPion (WELLBUTRIN SR) 150 MG 12 hr tablet Take 150 mg by mouth 2 (two) times daily.    Historical Provider, MD  celecoxib (CELEBREX) 200 MG capsule 1 tab po q day with food for pain and  swelling Patient not taking: Reported on 10/21/2015 07/27/15   Kirstin Shepperson, PA-C  cetirizine (ZYRTEC) 10 MG tablet Take 10 mg by mouth daily.    Historical Provider, MD  citalopram (CELEXA) 40 MG tablet Take 40 mg by mouth daily. 06/23/15   Historical Provider, MD  docusate sodium (COLACE) 100 MG capsule 1 tab 2 times a day while on narcotics.  STOOL SOFTENER Patient not taking: Reported on 10/21/2015 07/27/15   Kirstin Shepperson, PA-C  gabapentin (NEURONTIN) 300 MG capsule Take 300-600 mg by mouth 2 (two) times daily. 300mg  in the morning and 600mg  at bedtime 04/17/15   Historical Provider, MD  meloxicam (MOBIC) 7.5 MG tablet Take 7.5 mg by mouth daily.    Historical Provider, MD  oxyCODONE (OXY IR/ROXICODONE) 5 MG immediate release tablet 1-2 tablets every 4-6 hrs as needed for pain Patient not taking: Reported on 10/21/2015 07/27/15  Kirstin Shepperson, PA-C  OxyCODONE (OXYCONTIN) 20 mg T12A 12 hr tablet Take 1 tablet (20 mg total) by mouth every 12 (twelve) hours. Patient not taking: Reported on 10/21/2015 07/27/15   Kirstin Shepperson, PA-C  polyethylene glycol (MIRALAX / GLYCOLAX) packet 17grams in 16 oz of water twice a day until bowel movement.  LAXITIVE.  Restart if two days since last bowel movement Patient not taking: Reported on 10/21/2015 07/27/15   Kirstin Shepperson, PA-C   BP 111/61 mmHg  Pulse 67  Temp(Src) 98.1 F (36.7 C) (Oral)  Resp 13  Ht 5\' 7"  (1.702 m)  Wt 245 lb (111.131 kg)  BMI 38.36 kg/m2   SpO2 98% Physical Exam  Constitutional: She appears well-developed and well-nourished. She appears distressed.  Appears uncomfortable  HENT:  Head: Normocephalic and atraumatic.  Eyes: Conjunctivae are normal. Pupils are equal, round, and reactive to light.  Neck: Neck supple. No tracheal deviation present. No thyromegaly present.  Cardiovascular: Normal rate and regular rhythm.   No murmur heard. Pulmonary/Chest: Effort normal and breath sounds normal.  Abdominal: Soft. Bowel sounds are normal. She exhibits no distension. There is no tenderness.  Musculoskeletal: Normal range of motion. She exhibits no edema or tenderness.  Neurological: She is alert. Coordination normal.  Skin: Skin is warm and dry. No rash noted.  Psychiatric: She has a normal mood and affect.  Nursing note and vitals reviewed.   ED Course  Procedures (including critical care time) Labs Review Labs Reviewed  BASIC METABOLIC PANEL - Abnormal; Notable for the following:    Glucose, Bld 120 (*)    Calcium 8.6 (*)    All other components within normal limits  CBC - Abnormal; Notable for the following:    Hemoglobin 11.4 (*)    All other components within normal limits  TROPONIN I    Imaging Review Dg Chest Port 1 View  11/13/2015  CLINICAL DATA:  60 year old former smoker presenting to the emergency department with acute onset of chest pain, shortness of breath and nausea/vomiting which began 30 min prior to emergency department admission. EXAM: PORTABLE CHEST 1 VIEW COMPARISON:  None. FINDINGS: Suboptimal inspiration. Cardiac silhouette normal and mediastinal contours unremarkable for the AP portable technique. Pulmonary parenchyma clear. Bronchovascular markings normal. Pulmonary vascularity normal. No pneumothorax. No visible pleural effusions. IMPRESSION: Suboptimal inspiration.  No acute cardiopulmonary disease. Electronically Signed   By: Evangeline Dakin M.D.   On: 11/13/2015 13:26   I have personally  reviewed and evaluated these images and lab results as part of my medical decision-making.   EKG Interpretation   Date/Time:  Sunday November 13 2015 12:51:23 EST Ventricular Rate:  66 PR Interval:  136 QRS Duration: 88 QT Interval:  376 QTC Calculation: 394 R Axis:   56 Text Interpretation:  Normal sinus rhythm Normal ECG No significant change  since last tracing Confirmed by Tallan Sandoz  MD, Shammond Arave [phone] on 11/13/2015  12:56:41 PM     2:35 PM patient resting comfortably asymptomatic after treatment with aspirin and topical nitrates. Chest x-ray viewed by me Results for orders placed or performed during the hospital encounter of [id number]  Basic metabolic panel  Result Value Ref Range   Sodium 137 135 - 145 mmol/L   Potassium 4.1 3.5 - 5.1 mmol/L   Chloride 105 101 - 111 mmol/L   CO2 23 22 - 32 mmol/L   Glucose, Bld 120 (H) 65 - 99 mg/dL   BUN 15 6 - 20 mg/dL   Creatinine, Ser 0.92 0.44 -  1.00 mg/dL   Calcium 8.6 (L) 8.9 - 10.3 mg/dL   GFR calc non Af Amer >60 >60 mL/min   GFR calc Af Amer >60 >60 mL/min   Anion gap 9 5 - 15  CBC  Result Value Ref Range   WBC 9.6 4.0 - 10.5 K/uL   RBC 4.35 3.87 - 5.11 MIL/uL   Hemoglobin 11.4 (L) 12.0 - 15.0 g/dL   HCT 36.8 36.0 - 46.0 %   MCV 84.6 78.0 - 100.0 fL   MCH 26.2 26.0 - 34.0 pg   MCHC 31.0 30.0 - 36.0 g/dL   RDW 14.5 11.5 - 15.5 %   Platelets 320 150 - 400 K/uL  Troponin I  Result Value Ref Range   Troponin I <0.03 <0.031 ng/mL   Dg Chest Port 1 View  11/13/2015  CLINICAL DATA:  60 year old former smoker presenting to the emergency department with acute onset of chest pain, shortness of breath and nausea/vomiting which began 30 min prior to emergency department admission. EXAM: PORTABLE CHEST 1 VIEW COMPARISON:  None. FINDINGS: Suboptimal inspiration. Cardiac silhouette normal and mediastinal contours unremarkable for the AP portable technique. Pulmonary parenchyma clear. Bronchovascular markings normal. Pulmonary vascularity  normal. No pneumothorax. No visible pleural effusions. IMPRESSION: Suboptimal inspiration.  No acute cardiopulmonary disease. Electronically Signed   By: Evangeline Dakin M.D.   On: 11/13/2015 13:26    MDM  Cardiac risk factors include family history, hypercholesterolemia, hypertension. Heart score equals 4 Final diagnoses:  Chest pain   Dr. Sheran Fava consulted and will see patient in the hospital plan transfer [facility], 23 hour observation, telemetry, evaluate for acute coronary syndrome  Dx #1 chest pain #2 anemia   Orlie Dakin, MD 11/13/15 1439

## 2015-11-13 NOTE — H&P (Signed)
[facility] History and Physical  Nicole [name] [id number] DOB: [birth year] DOA: 11/13/2015  Referring physician: EDP  PCP: Tivis Ringer, MD   Chief Complaint: chest pain  HPI: Nicole [name] is a 60 y.o. female with PMH of HTN, Dyslipidemia, H/o gastric bypass surgery, mild iron deficiency anemia presents to the ER with the above complaints. Pt reports being in her usual state of health this morning, she went out to have brunch with her daughter, she had pancake, eggs, coffee etc and then they left the restaurant, on the way home she started experiencing this burning pain across her chest, then it moved lower down to the substernal area and there was a squeezing chest pain/discomfort, associated with some shortness of breath and nausea. This radiated to both her arms down to her elbows. Her daughter then took her to [facility] in [place]. In ED, Vitals, EKG, CXR, troponin unremarkable, TRH consulted for admission/observation  Review of Systems: positives bolded Constitutional:  No weight loss, night sweats, Fevers, chills, fatigue.  HEENT:  No headaches, Difficulty swallowing,Tooth/dental problems,Sore throat,  No sneezing, itching, ear ache, nasal congestion, post nasal drip,  Cardio-vascular:  No chest pain, Orthopnea, PND, swelling in lower extremities, anasarca, dizziness, palpitations  GI:  No heartburn, indigestion, abdominal pain, nausea, vomiting, diarrhea, change in bowel habits, loss of appetite  Resp:  No shortness of breath at rest. No excess mucus, no productive cough, No non-productive cough, No coughing up of blood.No change in color of mucus.No wheezing.No chest wall deformity  Skin:  no rash or lesions.  GU:  no dysuria, change in color of urine, no urgency or frequency. No flank pain.  Musculoskeletal:  No joint pain or swelling. No decreased range of motion. No back pain.  Psych:  No change in mood or affect. No depression or anxiety. No  memory loss.   Past Medical History  Diagnosis Date  . Arthritis   . Allergy   . Primary localized osteoarthritis of left knee   . Hypertension   . Hyperlipidemia   . Anemia     iron deficiency  . Back pain 07/25/2015   Past Surgical History  Procedure Laterality Date  . Abdominal hysterectomy    . Spine surgery    . Gastric bypass    . Total knee arthroplasty Left 07/25/2015    Procedure: TOTAL KNEE ARTHROPLASTY;  Surgeon: Elsie Saas, MD;  Location: [facility];  Service: Orthopedics;  Laterality: Left;  . Rotator cuff repair Right 2003   Social History:  reports that she has never smoked. She has never used smokeless tobacco. She reports that she does not drink alcohol or use illicit drugs.  Allergies  Allergen Reactions  . Codeine Hives  . Contrast Media [Iodinated Diagnostic Agents] Hives     family history -positive for premature CAD in father  Prior to Admission medications   Medication Sig Start Date End Date Taking? Authorizing Provider  aspirin EC 325 MG EC tablet 1 tab a day for the next 30 days to prevent blood clots Patient taking differently: Take 325 mg by mouth daily. 1 tab a day for the next 30 days to prevent blood clots 07/27/15  Yes Kirstin Shepperson, PA-C  BENICAR 40 MG tablet Take 20 mg by mouth daily. 06/18/15  Yes Historical Provider, MD  buPROPion (WELLBUTRIN SR) 150 MG 12 hr tablet Take 150 mg by mouth 2 (two) times daily.   Yes Historical Provider, MD  cetirizine (ZYRTEC) 10 MG tablet Take 10 mg  by mouth daily.   Yes Historical Provider, MD  citalopram (CELEXA) 40 MG tablet Take 40 mg by mouth daily. 06/23/15  Yes Historical Provider, MD  gabapentin (NEURONTIN) 300 MG capsule Take 300 mg by mouth at bedtime.  04/17/15  Yes Historical Provider, MD  amoxicillin-clavulanate (AUGMENTIN) 875-125 MG tablet Take 1 tablet by mouth 2 (two) times daily. Patient not taking: Reported on 11/13/2015 10/21/15   Darlyne Russian, MD  buPROPion (WELLBUTRIN XL) 150 MG 24 hr  tablet Take 150 mg by mouth daily. 10/27/15   Historical Provider, MD  celecoxib (CELEBREX) 200 MG capsule 1 tab po q day with food for pain and  swelling Patient not taking: Reported on 10/21/2015 07/27/15   Kirstin Shepperson, PA-C  docusate sodium (COLACE) 100 MG capsule 1 tab 2 times a day while on narcotics.  STOOL SOFTENER Patient not taking: Reported on 10/21/2015 07/27/15   Kirstin Shepperson, PA-C  fluticasone (FLONASE) 50 MCG/ACT nasal spray Place 2 sprays into both nostrils daily as needed. 10/27/15   Historical Provider, MD  meloxicam (MOBIC) 15 MG tablet Take 15 mg by mouth daily. 10/27/15   Historical Provider, MD  meloxicam (MOBIC) 7.5 MG tablet Take 7.5 mg by mouth daily.    Historical Provider, MD  oxyCODONE (OXY IR/ROXICODONE) 5 MG immediate release tablet 1-2 tablets every 4-6 hrs as needed for pain Patient not taking: Reported on 10/21/2015 07/27/15   Kirstin Shepperson, PA-C  OxyCODONE (OXYCONTIN) 20 mg T12A 12 hr tablet Take 1 tablet (20 mg total) by mouth every 12 (twelve) hours. Patient not taking: Reported on 10/21/2015 07/27/15   Kirstin Shepperson, PA-C  polyethylene glycol (MIRALAX / GLYCOLAX) packet 17grams in 16 oz of water twice a day until bowel movement.  LAXITIVE.  Restart if two days since last bowel movement Patient not taking: Reported on 10/21/2015 07/27/15   Matthew Saras, PA-C   Physical Exam: Filed Vitals:   11/13/15 1430 11/13/15 1447 11/13/15 1624 11/13/15 1632  BP: 115/68 124/60  119/71  Pulse:  64  69  Temp:    98.2 F (36.8 C)  TempSrc:      Resp: 11 12  13   Height:      Weight:   110.95 kg (244 lb 9.6 oz)   SpO2:  97%  98%    Wt Readings from Last 3 Encounters:  11/13/15 110.95 kg (244 lb 9.6 oz)  10/21/15 114.488 kg (252 lb 6.4 oz)  07/25/15 113.201 kg (249 lb 9 oz)    General:  Appears calm and comfortable, AAOx3, no distress Eyes: PERRL, normal lids, irises & conjunctiva ENT: grossly normal hearing, lips & tongue Neck: no LAD, masses or  thyromegaly Cardiovascular: RRR, no m/r/g. No LE edema. Telemetry: SR, no arrhythmias  Respiratory: CTA bilaterally, no w/r/r. Normal respiratory effort. Abdomen: soft, ntnd Skin: no rash or induration seen on limited exam Musculoskeletal: grossly normal tone BUE/BLE Psychiatric: grossly normal mood and affect, speech fluent and appropriate Neurologic: grossly non-focal.          Labs on Admission:  Basic Metabolic Panel:  Recent Labs Lab 11/13/15 1300  NA 137  K 4.1  CL 105  CO2 23  GLUCOSE 120*  BUN 15  CREATININE 0.92  CALCIUM 8.6*   Liver Function Tests: No results for input(s): AST, ALT, ALKPHOS, BILITOT, PROT, ALBUMIN in the last 168 hours. No results for input(s): LIPASE, AMYLASE in the last 168 hours. No results for input(s): AMMONIA in the last 168 hours. CBC:  Recent Labs  Lab 11/13/15 1300  WBC 9.6  HGB 11.4*  HCT 36.8  MCV 84.6  PLT 320   Cardiac Enzymes:  Recent Labs Lab 11/13/15 1300  TROPONINI <0.03    BNP (last 3 results) No results for input(s): BNP in the last 8760 hours.  ProBNP (last 3 results) No results for input(s): PROBNP in the last 8760 hours.  CBG: No results for input(s): GLUCAP in the last 168 hours.  Radiological Exams on Admission: Dg Chest Port 1 View  11/13/2015  CLINICAL DATA:  60 year old former smoker presenting to the emergency department with acute onset of chest pain, shortness of breath and nausea/vomiting which began 30 min prior to emergency department admission. EXAM: PORTABLE CHEST 1 VIEW COMPARISON:  None. FINDINGS: Suboptimal inspiration. Cardiac silhouette normal and mediastinal contours unremarkable for the AP portable technique. Pulmonary parenchyma clear. Bronchovascular markings normal. Pulmonary vascularity normal. No pneumothorax. No visible pleural effusions. IMPRESSION: Suboptimal inspiration.  No acute cardiopulmonary disease. Electronically Signed   By: Evangeline Dakin M.D.   On: 11/13/2015 13:26     EKG: Independently reviewed. NSR, no acute ST T wave changes  Assessment/Plan Principal Problem:    Atypical Chest pain -EKG normal and troponins negative -with some cardiac risk factors-HTN, Dyslipidemia, family history of premature CAD in father -admit to Tele, cycle cardiac enzymes -Cards consult in am -NPO after midnight    Benign essential HTN -stable, benicar changed to Irbesartan(on formulary)    Dyslipidemia -FU lipids in am   Mild Iron deficiency anemia -stable, FU with PCP   Depression -continue celexa and Wellbutrin  Code Status: FUll Code DVT Prophylaxis: Lovenox Family Communication: none at bedside Disposition Plan: hopefully home tomorrow Time spent: 35min  Lakeyn Dokken [facility] Pager [phone]

## 2015-11-13 NOTE — ED Notes (Signed)
Pt placed on 2L O2 via nasal cannula for comfort.

## 2015-11-13 NOTE — ED Notes (Signed)
12 Lead ECG performed in Triage and to EDP for immediate review

## 2015-11-13 NOTE — ED Notes (Signed)
[facility] Transport Team approx arrvial - ETA 15 min

## 2015-11-13 NOTE — ED Notes (Signed)
[facility] Team at bedside 

## 2015-11-13 NOTE — ED Notes (Signed)
Pt was getting into car after lunch today and felt gripping sharp pressure and pain in upper mid chest along with n/v and weakness in both arms.

## 2015-11-13 NOTE — ED Notes (Signed)
Phone Hand Off Report given to [facility] Transport Team 

## 2015-11-13 NOTE — ED Notes (Signed)
Phone Hand Off Report given to Judith-RN

## 2015-11-14 ENCOUNTER — Encounter (HOSPITAL_COMMUNITY)
Admission: EM | Disposition: A | Discharge status: Home / Self Care | Payer: Self-pay | Source: Home / Self Care | Attending: Emergency Medicine

## 2015-11-14 DIAGNOSIS — I209 Angina pectoris, unspecified: Secondary | ICD-10-CM | POA: Diagnosis not present

## 2015-11-14 DIAGNOSIS — E785 Hyperlipidemia, unspecified: Secondary | ICD-10-CM

## 2015-11-14 DIAGNOSIS — F329 Major depressive disorder, single episode, unspecified: Secondary | ICD-10-CM

## 2015-11-14 DIAGNOSIS — I10 Essential (primary) hypertension: Secondary | ICD-10-CM

## 2015-11-14 DIAGNOSIS — E669 Obesity, unspecified: Secondary | ICD-10-CM

## 2015-11-14 DIAGNOSIS — I214 Non-ST elevation (NSTEMI) myocardial infarction: Principal | ICD-10-CM

## 2015-11-14 DIAGNOSIS — R079 Chest pain, unspecified: Secondary | ICD-10-CM | POA: Diagnosis not present

## 2015-11-14 HISTORY — PX: CARDIAC CATHETERIZATION: SHX172

## 2015-11-14 LAB — COMPREHENSIVE METABOLIC PANEL
ALBUMIN: 3.4 g/dL — AB (ref 3.5–5.0)
ALK PHOS: 68 U/L (ref 38–126)
ALT: 18 U/L (ref 14–54)
ANION GAP: 7 (ref 5–15)
AST: 17 U/L (ref 15–41)
BILIRUBIN TOTAL: 0.2 mg/dL — AB (ref 0.3–1.2)
BUN: 12 mg/dL (ref 6–20)
CALCIUM: 8.8 mg/dL — AB (ref 8.9–10.3)
CO2: 27 mmol/L (ref 22–32)
CREATININE: 0.8 mg/dL (ref 0.44–1.00)
Chloride: 107 mmol/L (ref 101–111)
GFR calc Af Amer: >60 mL/min (ref >60)
GFR calc non Af Amer: >60 mL/min (ref >60)
GLUCOSE: 114 mg/dL — AB (ref 65–99)
Potassium: 4.3 mmol/L (ref 3.5–5.1)
Sodium: 141 mmol/L (ref 135–145)
TOTAL PROTEIN: 6.5 g/dL (ref 6.5–8.1)

## 2015-11-14 LAB — CBC
HEMATOCRIT: 33.7 % — AB (ref 36.0–46.0)
HEMOGLOBIN: 10.8 g/dL — AB (ref 12.0–15.0)
MCH: 27.1 pg (ref 26.0–34.0)
MCHC: 32 g/dL (ref 30.0–36.0)
MCV: 84.5 fL (ref 78.0–100.0)
PLATELETS: 239 10*3/uL (ref 150–400)
RBC: 3.99 MIL/uL (ref 3.87–5.11)
RDW: 14.5 % (ref 11.5–15.5)
WBC: 8 10*3/uL (ref 4.0–10.5)

## 2015-11-14 LAB — LIPID PANEL
Cholesterol: 199 mg/dL (ref 0–200)
HDL: 40 mg/dL — AB (ref >40)
LDL CALC: 136 mg/dL — AB (ref 0–99)
Total CHOL/HDL Ratio: 5 RATIO
Triglycerides: 116 mg/dL (ref <150)
VLDL: 23 mg/dL (ref 0–40)

## 2015-11-14 LAB — PROTIME-INR
INR: 1.15 (ref 0.00–1.49)
PROTHROMBIN TIME: 14.9 s (ref 11.6–15.2)

## 2015-11-14 LAB — TROPONIN I
Troponin I: 0.27 ng/mL — ABNORMAL HIGH (ref <0.031)
Troponin I: 0.13 ng/mL — ABNORMAL HIGH (ref <0.031)

## 2015-11-14 SURGERY — LEFT HEART CATH AND CORONARY ANGIOGRAPHY

## 2015-11-14 MED ORDER — FENTANYL CITRATE (PF) 100 MCG/2ML IJ SOLN
INTRAMUSCULAR | Status: AC
  Filled 2015-11-14: qty 2

## 2015-11-14 MED ORDER — VERAPAMIL HCL 2.5 MG/ML IV SOLN
INTRA_ARTERIAL | Status: DC | PRN
  Administered 2015-11-14: 16:00:00 via INTRA_ARTERIAL

## 2015-11-14 MED ORDER — PANTOPRAZOLE SODIUM 40 MG PO TBEC
40.0000 mg | DELAYED_RELEASE_TABLET | Freq: Every day | ORAL | Status: DC
  Administered 2015-11-14 – 2015-11-15 (×2): 40 mg via ORAL
  Filled 2015-11-14 (×2): qty 1

## 2015-11-14 MED ORDER — SODIUM CHLORIDE 0.9% FLUSH: 3.0000 mL | INTRAVENOUS | Status: DC | PRN

## 2015-11-14 MED ORDER — HEPARIN BOLUS VIA INFUSION
4000.0000 [IU] | Freq: Once | INTRAVENOUS | Status: AC
  Administered 2015-11-14: 4000 [IU] via INTRAVENOUS
  Filled 2015-11-14: qty 4000

## 2015-11-14 MED ORDER — SODIUM CHLORIDE 0.9 % IV SOLN: 250.0000 mL | INTRAVENOUS | Status: DC | PRN

## 2015-11-14 MED ORDER — IOHEXOL 350 MG/ML SOLN
INTRAVENOUS | Status: DC | PRN
  Administered 2015-11-14: 60 mL via INTRA_ARTERIAL

## 2015-11-14 MED ORDER — FAMOTIDINE IN NACL 20-0.9 MG/50ML-% IV SOLN
20.0000 mg | INTRAVENOUS | Status: AC
  Administered 2015-11-14: 20 mg via INTRAVENOUS
  Filled 2015-11-14: qty 50

## 2015-11-14 MED ORDER — SIMVASTATIN 20 MG PO TABS: 20.0000 mg | ORAL_TABLET | Freq: Every day | ORAL | Status: DC

## 2015-11-14 MED ORDER — HEPARIN (PORCINE) IN NACL 100-0.45 UNIT/ML-% IJ SOLN
1300.0000 [IU]/h | INTRAMUSCULAR | Status: DC
  Administered 2015-11-14: 1300 [IU]/h via INTRAVENOUS
  Filled 2015-11-14: qty 250

## 2015-11-14 MED ORDER — NITROGLYCERIN 1 MG/10 ML FOR IR/CATH LAB
INTRA_ARTERIAL | Status: DC | PRN
  Administered 2015-11-14: 17:00:00

## 2015-11-14 MED ORDER — SIMVASTATIN 40 MG PO TABS: 40.0000 mg | ORAL_TABLET | Freq: Every day | ORAL | Status: DC

## 2015-11-14 MED ORDER — SODIUM CHLORIDE 0.9% FLUSH
3.0000 mL | Freq: Two times a day (BID) | INTRAVENOUS | Status: DC
  Administered 2015-11-14 – 2015-11-15 (×2): 3 mL via INTRAVENOUS

## 2015-11-14 MED ORDER — HEPARIN SODIUM (PORCINE) 1000 UNIT/ML IJ SOLN
INTRAMUSCULAR | Status: AC
  Filled 2015-11-14: qty 1

## 2015-11-14 MED ORDER — HEPARIN SODIUM (PORCINE) 1000 UNIT/ML IJ SOLN
INTRAMUSCULAR | Status: DC | PRN
  Administered 2015-11-14: 5000 [IU] via INTRAVENOUS

## 2015-11-14 MED ORDER — DIPHENHYDRAMINE HCL 50 MG/ML IJ SOLN
25.0000 mg | INTRAMUSCULAR | Status: AC
  Administered 2015-11-14: 25 mg via INTRAVENOUS
  Filled 2015-11-14: qty 1

## 2015-11-14 MED ORDER — ACETAMINOPHEN 325 MG PO TABS: 650.0000 mg | ORAL_TABLET | ORAL | Status: DC | PRN

## 2015-11-14 MED ORDER — METOPROLOL TARTRATE 12.5 MG HALF TABLET
12.5000 mg | ORAL_TABLET | Freq: Two times a day (BID) | ORAL | Status: DC
  Administered 2015-11-14 – 2015-11-15 (×3): 12.5 mg via ORAL
  Filled 2015-11-14 (×3): qty 1

## 2015-11-14 MED ORDER — ASPIRIN 81 MG PO CHEW
81.0000 mg | CHEWABLE_TABLET | Freq: Every day | ORAL | Status: DC
  Administered 2015-11-15: 81 mg via ORAL
  Filled 2015-11-14: qty 1

## 2015-11-14 MED ORDER — ASPIRIN 81 MG PO CHEW: 81.0000 mg | CHEWABLE_TABLET | ORAL | Status: DC

## 2015-11-14 MED ORDER — ONDANSETRON HCL 4 MG/2ML IJ SOLN: 4.0000 mg | Freq: Four times a day (QID) | INTRAMUSCULAR | Status: DC | PRN

## 2015-11-14 MED ORDER — LIDOCAINE HCL (PF) 1 % IJ SOLN
INTRAMUSCULAR | Status: AC
  Filled 2015-11-14: qty 30

## 2015-11-14 MED ORDER — FENTANYL CITRATE (PF) 100 MCG/2ML IJ SOLN
INTRAMUSCULAR | Status: DC | PRN
  Administered 2015-11-14: 25 ug via INTRAVENOUS

## 2015-11-14 MED ORDER — ATORVASTATIN CALCIUM 80 MG PO TABS
80.0000 mg | ORAL_TABLET | Freq: Every day | ORAL | Status: DC
  Administered 2015-11-14: 80 mg via ORAL
  Filled 2015-11-14: qty 1

## 2015-11-14 MED ORDER — VERAPAMIL HCL 2.5 MG/ML IV SOLN
INTRAVENOUS | Status: AC
  Filled 2015-11-14: qty 2

## 2015-11-14 MED ORDER — METHYLPREDNISOLONE SODIUM SUCC 125 MG IJ SOLR
125.0000 mg | INTRAMUSCULAR | Status: AC
  Administered 2015-11-14: 125 mg via INTRAVENOUS
  Filled 2015-11-14: qty 2

## 2015-11-14 MED ORDER — SODIUM CHLORIDE 0.9% FLUSH
3.0000 mL | Freq: Two times a day (BID) | INTRAVENOUS | Status: DC
  Administered 2015-11-14: 3 mL via INTRAVENOUS

## 2015-11-14 MED ORDER — MIDAZOLAM HCL 2 MG/2ML IJ SOLN
INTRAMUSCULAR | Status: AC
  Filled 2015-11-14: qty 2

## 2015-11-14 MED ORDER — SODIUM CHLORIDE 0.9 % IV SOLN
INTRAVENOUS | Status: DC
  Administered 2015-11-14: 11:00:00 via INTRAVENOUS

## 2015-11-14 MED ORDER — SODIUM CHLORIDE 0.9 % WEIGHT BASED INFUSION
3.0000 mL/kg/h | INTRAVENOUS | Status: AC
  Administered 2015-11-14 (×2): 3 mL/kg/h via INTRAVENOUS

## 2015-11-14 MED ORDER — MIDAZOLAM HCL 2 MG/2ML IJ SOLN
INTRAMUSCULAR | Status: DC | PRN
  Administered 2015-11-14: 1 mg via INTRAVENOUS

## 2015-11-14 MED ORDER — NITROGLYCERIN 1 MG/10 ML FOR IR/CATH LAB
INTRA_ARTERIAL | Status: AC
  Filled 2015-11-14: qty 10

## 2015-11-14 SURGICAL SUPPLY — 12 items
CATH INFINITI 5 FR JL3.5 (CATHETERS) ×2 IMPLANT
CATH INFINITI 5FR ANG PIGTAIL (CATHETERS) ×3 IMPLANT
CATH INFINITI JR4 5F (CATHETERS) ×3 IMPLANT
DEVICE RAD COMP TR BAND LRG (VASCULAR PRODUCTS) ×3 IMPLANT
GLIDESHEATH SLEND A-KIT 6F 22G (SHEATH) ×3 IMPLANT
KIT HEART LEFT (KITS) ×3 IMPLANT
PACK CARDIAC CATHETERIZATION (CUSTOM PROCEDURE TRAY) ×3 IMPLANT
SYR MEDRAD MARK V 150ML (SYRINGE) ×3 IMPLANT
TRANSDUCER W/STOPCOCK (MISCELLANEOUS) ×3 IMPLANT
TUBING CIL FLEX 10 FLL-RA (TUBING) ×3 IMPLANT
WIRE HI TORQ VERSACORE-J 145CM (WIRE) ×3 IMPLANT
WIRE SAFE-T 1.5MM-J .035X260CM (WIRE) ×3 IMPLANT

## 2015-11-14 NOTE — Consult Note (Signed)
Reason for Consult: Chest Pain Referring Physician:   [name] is an 60 y.o. female.  HPI:  Patient is a 60 year old obese female with no prior cardiac history but with a history of hypertension, hyperlipidemia, back pain osteoarthritis, anemia and allergies.  She had a left total knee replacement back in October.  She reports that yesterday she had brunch and rhythm but a mile from the restaurant developed a raw feeling in her chest and then it became severely tight 10 out of 10 in intensity.  Pain radiated to her arms.  She was nauseated, vomited, shortness of breath. She was given 4 baby aspirin.  Currently the tightness has resolved however, she does feel sore in her chest. Seems to be worse if she leans forward or takes a deep breath. Her father is currently 1 years old and had an MI at age 50 and later had coronary artery bypass grafting 3.  Her mom had aortic valve replacement.  Past Medical History  Diagnosis Date  . Arthritis   . Allergy   . Primary localized osteoarthritis of left knee   . Hypertension   . Hyperlipidemia   . Anemia     iron deficiency  . Back pain 07/25/2015    Past Surgical History  Procedure Laterality Date  . Abdominal hysterectomy    . Spine surgery    . Gastric bypass    . Total knee arthroplasty Left 07/25/2015    Procedure: TOTAL KNEE ARTHROPLASTY;  Surgeon: Elsie Saas, MD;  Location: [facility];  Service: Orthopedics;  Laterality: Left;  . Rotator cuff repair Right 2003    No family history on file.  Social History:  reports that she has never smoked. She has never used smokeless tobacco. She reports that she does not drink alcohol or use illicit drugs.  Allergies:  Allergies  Allergen Reactions  . Codeine Hives  . Contrast Media [Iodinated Diagnostic Agents] Hives    Medications: Scheduled Meds: . aspirin EC  325 mg Oral Daily  . buPROPion  150 mg Oral Daily  . citalopram  40 mg Oral Daily  . gabapentin  300 mg Oral QHS    . irbesartan  37.5 mg Oral Daily  . metoprolol tartrate  12.5 mg Oral BID  . pantoprazole  40 mg Oral Q1200  . simvastatin  20 mg Oral q1800   Continuous Infusions:  PRN Meds:.acetaminophen **OR** acetaminophen, ondansetron **OR** ondansetron (ZOFRAN) IV   Results for orders placed or performed during the hospital encounter of 11/13/15 (from the past 48 hour(s))  Basic metabolic panel     Status: Abnormal   Collection Time: 11/13/15  1:00 PM  Result Value Ref Range   Sodium 137 135 - 145 mmol/L   Potassium 4.1 3.5 - 5.1 mmol/L   Chloride 105 101 - 111 mmol/L   CO2 23 22 - 32 mmol/L   Glucose, Bld 120 (H) 65 - 99 mg/dL   BUN 15 6 - 20 mg/dL   Creatinine, Ser 0.92 0.44 - 1.00 mg/dL   Calcium 8.6 (L) 8.9 - 10.3 mg/dL   GFR calc non Af Amer >60 >60 mL/min   GFR calc Af Amer >60 >60 mL/min    Comment: (NOTE) The eGFR has been calculated using the CKD EPI equation. This calculation has not been validated in all clinical situations. eGFR's persistently <60 mL/min signify possible Chronic Kidney Disease.    Anion gap 9 5 - 15  CBC     Status:  Abnormal   Collection Time: 11/13/15  1:00 PM  Result Value Ref Range   WBC 9.6 4.0 - 10.5 K/uL   RBC 4.35 3.87 - 5.11 MIL/uL   Hemoglobin 11.4 (L) 12.0 - 15.0 g/dL   HCT 36.8 36.0 - 46.0 %   MCV 84.6 78.0 - 100.0 fL   MCH 26.2 26.0 - 34.0 pg   MCHC 31.0 30.0 - 36.0 g/dL   RDW 14.5 11.5 - 15.5 %   Platelets 320 150 - 400 K/uL  Troponin I     Status: None   Collection Time: 11/13/15  1:00 PM  Result Value Ref Range   Troponin I <0.03 <0.031 ng/mL    Comment:        NO INDICATION OF MYOCARDIAL INJURY.   Troponin I (q 6hr x 3)     Status: Abnormal   Collection Time: 11/13/15  7:08 PM  Result Value Ref Range   Troponin I 0.39 (H) <0.031 ng/mL    Comment:        PERSISTENTLY INCREASED TROPONIN VALUES IN THE RANGE OF 0.04-0.49 ng/mL CAN BE SEEN IN:       -UNSTABLE ANGINA       -CONGESTIVE HEART FAILURE       -MYOCARDITIS        -CHEST TRAUMA       -ARRYHTHMIAS       -LATE PRESENTING MYOCARDIAL INFARCTION       -COPD   CLINICAL FOLLOW-UP RECOMMENDED.   Troponin I (q 6hr x 3)     Status: Abnormal   Collection Time: 11/13/15 11:56 PM  Result Value Ref Range   Troponin I 0.27 (H) <0.031 ng/mL    Comment:        PERSISTENTLY INCREASED TROPONIN VALUES IN THE RANGE OF 0.04-0.49 ng/mL CAN BE SEEN IN:       -UNSTABLE ANGINA       -CONGESTIVE HEART FAILURE       -MYOCARDITIS       -CHEST TRAUMA       -ARRYHTHMIAS       -LATE PRESENTING MYOCARDIAL INFARCTION       -COPD   CLINICAL FOLLOW-UP RECOMMENDED.   Lipid panel     Status: Abnormal   Collection Time: 11/13/15 11:56 PM  Result Value Ref Range   Cholesterol 199 0 - 200 mg/dL   Triglycerides 116 <150 mg/dL   HDL 40 (L) >40 mg/dL   Total CHOL/HDL Ratio 5.0 RATIO   VLDL 23 0 - 40 mg/dL   LDL Cholesterol 136 (H) 0 - 99 mg/dL    Comment:        Total Cholesterol/HDL:CHD Risk Coronary Heart Disease Risk Table                     Men   Women  1/2 Average Risk   3.4   3.3  Average Risk       5.0   4.4  2 X Average Risk   9.6   7.1  3 X Average Risk  23.4   11.0        Use the calculated Patient Ratio above and the CHD Risk Table to determine the patient's CHD Risk.        ATP III CLASSIFICATION (LDL):  <100     mg/dL   Optimal  100-129  mg/dL   Near or Above  Optimal  130-159  mg/dL   Borderline  160-189  mg/dL   High  >190     mg/dL   Very High   Troponin I (q 6hr x 3)     Status: Abnormal   Collection Time: 11/14/15  5:45 AM  Result Value Ref Range   Troponin I 0.13 (H) <0.031 ng/mL    Comment:        PERSISTENTLY INCREASED TROPONIN VALUES IN THE RANGE OF 0.04-0.49 ng/mL CAN BE SEEN IN:       -UNSTABLE ANGINA       -CONGESTIVE HEART FAILURE       -MYOCARDITIS       -CHEST TRAUMA       -ARRYHTHMIAS       -LATE PRESENTING MYOCARDIAL INFARCTION       -COPD   CLINICAL FOLLOW-UP RECOMMENDED.   CBC     Status: Abnormal    Collection Time: 11/14/15  5:45 AM  Result Value Ref Range   WBC 8.0 4.0 - 10.5 K/uL   RBC 3.99 3.87 - 5.11 MIL/uL   Hemoglobin 10.8 (L) 12.0 - 15.0 g/dL   HCT 33.7 (L) 36.0 - 46.0 %   MCV 84.5 78.0 - 100.0 fL   MCH 27.1 26.0 - 34.0 pg   MCHC 32.0 30.0 - 36.0 g/dL   RDW 14.5 11.5 - 15.5 %   Platelets 239 150 - 400 K/uL  Comprehensive metabolic panel     Status: Abnormal   Collection Time: 11/14/15  5:45 AM  Result Value Ref Range   Sodium 141 135 - 145 mmol/L   Potassium 4.3 3.5 - 5.1 mmol/L   Chloride 107 101 - 111 mmol/L   CO2 27 22 - 32 mmol/L   Glucose, Bld 114 (H) 65 - 99 mg/dL   BUN 12 6 - 20 mg/dL   Creatinine, Ser 0.80 0.44 - 1.00 mg/dL   Calcium 8.8 (L) 8.9 - 10.3 mg/dL   Total Protein 6.5 6.5 - 8.1 g/dL   Albumin 3.4 (L) 3.5 - 5.0 g/dL   AST 17 15 - 41 U/L   ALT 18 14 - 54 U/L   Alkaline Phosphatase 68 38 - 126 U/L   Total Bilirubin 0.2 (L) 0.3 - 1.2 mg/dL   GFR calc non Af Amer >60 >60 mL/min   GFR calc Af Amer >60 >60 mL/min    Comment: (NOTE) The eGFR has been calculated using the CKD EPI equation. This calculation has not been validated in all clinical situations. eGFR's persistently <60 mL/min signify possible Chronic Kidney Disease.    Anion gap 7 5 - 15    Dg Chest Port 1 View  11/13/2015  CLINICAL DATA:  60 year old former smoker presenting to the emergency department with acute onset of chest pain, shortness of breath and nausea/vomiting which began 30 min prior to emergency department admission. EXAM: PORTABLE CHEST 1 VIEW COMPARISON:  None. FINDINGS: Suboptimal inspiration. Cardiac silhouette normal and mediastinal contours unremarkable for the AP portable technique. Pulmonary parenchyma clear. Bronchovascular markings normal. Pulmonary vascularity normal. No pneumothorax. No visible pleural effusions. IMPRESSION: Suboptimal inspiration.  No acute cardiopulmonary disease. Electronically Signed   By: Evangeline Dakin M.D.   On: 11/13/2015 13:26    Review  of Systems  Constitutional: Negative for fever, chills and diaphoresis.  HENT: Negative for congestion.   Eyes: Positive for double vision.  Respiratory: Positive for shortness of breath. Negative for cough and wheezing.   Cardiovascular: Positive for chest pain and leg  swelling (resolved). Negative for orthopnea.  Gastrointestinal: Positive for nausea and vomiting. Negative for abdominal pain, diarrhea, blood in stool and melena.  Musculoskeletal: Negative for myalgias.  Neurological: Negative for dizziness.  All other systems reviewed and are negative.  Blood pressure 121/65, pulse 67, temperature 97.9 F (36.6 C), temperature source Oral, resp. rate 13, height _0  (1.702 m), weight 244 lb 4.8 oz (110.814 kg), SpO2 98 %. Physical Exam  Nursing note and vitals reviewed.  Obese, well developed, in no acute distress HEENT: Pupils are equal round react to light accommodation extraocular movements are intact.  Neck: no JVDNo cervical lymphadenopathy. Cardiac: Regular rate and rhythm without murmurs rubs or gallops. Lungs:  clear to auscultation bilaterally, no wheezing, rhonchi or rales Abd: soft, nontender, positive bowel sounds all quadrants, no hepatosplenomegaly Ext: no lower extremity edema.  2+ radial and 1+ dorsalis pedis pulses. Skin: warm and dry Neuro:  Grossly normal   Assessment/Plan: Principal Problem:   NSTEMI (non-ST elevated myocardial infarction) ([place]) Active Problems:   Chest pain   Benign essential HTN   Dyslipidemia   Obesity (BMI 35.0-39.9 without comorbidity) ([place])   60 year old obese female with no prior cardiac history but with a history of hypertension, hyperlipidemia, back pain osteoarthritis, anemia and allergies.  She had a left total knee replacement back in October.  Patient presents with chest pain described as tightness radiating to her arms associated with nausea, vomiting, shortness of breath.  EKG shows no acute ischemic changes however, troponin  was negative at first and then peaked at 0.39 at 1708hrs last night.  She was started on aspirin, beta blocker, statin. I have started IV heparin this morning.  I have increased the dose of simvastatin to 40 mg.  Should be scheduled for left heart catheterization  The patient understands that risks include but are not limited to stroke (1 in 1000), death (1 in 1000), kidney failure [usually temporary] (1 in 500), bleeding (1 in 200), allergic reaction [possibly serious] (1 in 200). The patient understands and is willing to proceed.  She will need to be premedicated with prednisone, Benadryl, Pepcid prior to catheterization due to contrast allergy.  She will be hydrated before cath.   Tarri Fuller, [place] 11/14/2015, 9:48 AM     Patient seen and examined. Agree with assessment and plan. Nicole [name] is a 60 year old female who is followed by Dr. Dagmar Hait for primary medical care. Her mother is a patient of Dr. Martinique.  The patient denies any previous known history of coronary artery disease but has a history of hypertension, and hyperlipidemia.  In October 2016 she underwent left total knee replacement surgery without cardiovascular abnormalities.  She is a Actuary in high school. She recently has returned to work.  She admits to some increased work-related stress.  Yesterday, she began to notice an initial chest burning sensation which then evolved into a squeezing tightness discomfort.  She presented to [facility].  Her chest squeezing has resolved but she still notes some intermittent residual chest burning.  Her ECG has been unremarkable.  However, troponins are mildly positive.with her strong family history, worrisome symptomatology and mildly positive serum troponins, I have recommended definitive cardiac catheterization. The risk and benefits of the procedure have been discussed in detail with the patient, as noted above.  Due to potential contrast allergy  will premedicate with solumedrol, Benadryl and Pepcid prior to the catheterization procedure. I reviewed the patient's laboratory.  She has normal renal  function with a GFR greater than 60; stage II, she has a history of hyperlipidemia with increased LDL cholesterol at 136.  She has been on simvastatin, but this may ultimately need to be changed to a high potency statin with target LDL less than 70.  She has mild anemia with a hemoglobin of 10.8 and hematocrit of 33.7.  She has normocytic indices.  She has remote history of gastric bypass surgery. Will hydrate and plan cardiac catheterization later today as schedule allows.  Troy Sine, MD, [facility] 11/14/2015 10:15 AM

## 2015-11-14 NOTE — Progress Notes (Signed)
ANTICOAGULATION CONSULT NOTE - Initial Consult  Pharmacy Consult for Heparin Indication: chest pain/ACS  Allergies  Allergen Reactions  . Codeine Hives  . Contrast Media [Iodinated Diagnostic Agents] Hives    Patient Measurements: Height: 5\' 7"  (170.2 cm) Weight: 244 lb 4.8 oz (110.814 kg) IBW/kg (Calculated) : 61.6 Heparin Dosing Weight: 87.2  Vital Signs: Temp: 97.9 F (36.6 C) (01/30 0500) Temp Source: Oral (01/30 0500) BP: 121/65 mmHg (01/30 0500) Pulse Rate: 67 (01/30 0500)  Labs:  Recent Labs  11/13/15 1300 11/13/15 1908 11/13/15 2356 11/14/15 0545  HGB 11.4*  --   --  10.8*  HCT 36.8  --   --  33.7*  PLT 320  --   --  239  CREATININE 0.92  --   --  0.80  TROPONINI <0.03 0.39* 0.27* 0.13*    Estimated Creatinine Clearance: 98.4 mL/min (by C-G formula based on Cr of 0.8).   Medical History: Past Medical History  Diagnosis Date  . Arthritis   . Allergy   . Primary localized osteoarthritis of left knee   . Hypertension   . Hyperlipidemia   . Anemia     iron deficiency  . Back pain 07/25/2015    Medications:  Prescriptions prior to admission  Medication Sig Dispense Refill Last Dose  . aspirin EC 325 MG EC tablet 1 tab a day for the next 30 days to prevent blood clots (Patient taking differently: Take 325 mg by mouth daily. 1 tab a day for the next 30 days to prevent blood clots) 30 tablet 0 11/13/2015 at Unknown time  . BENICAR 40 MG tablet Take 20 mg by mouth daily.  6 11/13/2015 at Unknown time  . buPROPion (WELLBUTRIN XL) 150 MG 24 hr tablet Take 150 mg by mouth daily.  6 11/13/2015 at Unknown time  . cetirizine (ZYRTEC) 10 MG tablet Take 10 mg by mouth daily.   11/13/2015 at Unknown time  . citalopram (CELEXA) 40 MG tablet Take 40 mg by mouth daily.  6 11/13/2015 at Unknown time  . fluticasone (FLONASE) 50 MCG/ACT nasal spray Place 2 sprays into both nostrils daily as needed for allergies or rhinitis.   11 unknown  . gabapentin (NEURONTIN) 300 MG  capsule Take 300 mg by mouth at bedtime.   3 11/12/2015 at Unknown time  . meloxicam (MOBIC) 15 MG tablet Take 15 mg by mouth daily.  6 11/13/2015 at Unknown time  . amoxicillin-clavulanate (AUGMENTIN) 875-125 MG tablet Take 1 tablet by mouth 2 (two) times daily. (Patient not taking: Reported on 11/13/2015) 20 tablet 0 Completed Course at Unknown time  . celecoxib (CELEBREX) 200 MG capsule 1 tab po q day with food for pain and  swelling (Patient not taking: Reported on 10/21/2015) 30 capsule 3 Not Taking at Unknown time  . docusate sodium (COLACE) 100 MG capsule 1 tab 2 times a day while on narcotics.  STOOL SOFTENER (Patient not taking: Reported on 10/21/2015) 60 capsule 0 Not Taking at Unknown time  . oxyCODONE (OXY IR/ROXICODONE) 5 MG immediate release tablet 1-2 tablets every 4-6 hrs as needed for pain (Patient not taking: Reported on 10/21/2015) 100 tablet 0 Not Taking at Unknown time  . OxyCODONE (OXYCONTIN) 20 mg T12A 12 hr tablet Take 1 tablet (20 mg total) by mouth every 12 (twelve) hours. (Patient not taking: Reported on 10/21/2015) 60 tablet 0 Not Taking at Unknown time  . polyethylene glycol (MIRALAX / GLYCOLAX) packet 17grams in 16 oz of water twice a day until  bowel movement.  LAXITIVE.  Restart if two days since last bowel movement (Patient not taking: Reported on 10/21/2015) 60 each 0 Not Taking at Unknown time   Scheduled:  . aspirin  81 mg Oral Pre-Cath  . aspirin EC  325 mg Oral Daily  . buPROPion  150 mg Oral Daily  . citalopram  40 mg Oral Daily  . diphenhydrAMINE  25 mg Intravenous Pre-Cath  . famotidine (PEPCID) IV  20 mg Intravenous Pre-Cath  . gabapentin  300 mg Oral QHS  . heparin  4,000 Units Intravenous Once  . irbesartan  37.5 mg Oral Daily  . methylPREDNISolone (SOLU-MEDROL) injection  125 mg Intravenous Pre-Cath  . metoprolol tartrate  12.5 mg Oral BID  . pantoprazole  40 mg Oral Q1200  . simvastatin  40 mg Oral q1800  . sodium chloride flush  3 mL Intravenous Q12H     Assessment: Nicole [name], pharmacy consulted for heparin dosing for NSTEMI/ACS Initial troponin negative, then peaked at 0.39 at 1708 last night  Goal of Therapy:  Heparin level 0.3-0.7 units/ml Monitor platelets by anticoagulation protocol: Yes   Plan:  Give 4000 units bolus x 1 Start heparin infusion at 1300 units/hr Check anti-Xa level in 6 hours and daily while on heparin Continue to monitor H&H and platelets  Thank you for allowing [place] to participate in this patients care. Jens Som, PharmD  11/14/2015,11:26 AM

## 2015-11-14 NOTE — H&P (View-Only) (Signed)
Reason for Consult: Chest Pain Referring Physician:   [name] is an 60 y.o. female.  HPI:  Patient is a 60 year old obese female with no prior cardiac history but with a history of hypertension, hyperlipidemia, back pain osteoarthritis, anemia and allergies.  She had a left total knee replacement back in October.  She reports that yesterday she had brunch and rhythm but a mile from the restaurant developed a raw feeling in her chest and then it became severely tight 10 out of 10 in intensity.  Pain radiated to her arms.  She was nauseated, vomited, shortness of breath. She was given 4 baby aspirin.  Currently the tightness has resolved however, she does feel sore in her chest. Seems to be worse if she leans forward or takes a deep breath. Her father is currently 1 years old and had an MI at age 50 and later had coronary artery bypass grafting 3.  Her mom had aortic valve replacement.  Past Medical History  Diagnosis Date  . Arthritis   . Allergy   . Primary localized osteoarthritis of left knee   . Hypertension   . Hyperlipidemia   . Anemia     iron deficiency  . Back pain 07/25/2015    Past Surgical History  Procedure Laterality Date  . Abdominal hysterectomy    . Spine surgery    . Gastric bypass    . Total knee arthroplasty Left 07/25/2015    Procedure: TOTAL KNEE ARTHROPLASTY;  Surgeon: Elsie Saas, MD;  Location: [facility];  Service: Orthopedics;  Laterality: Left;  . Rotator cuff repair Right 2003    No family history on file.  Social History:  reports that she has never smoked. She has never used smokeless tobacco. She reports that she does not drink alcohol or use illicit drugs.  Allergies:  Allergies  Allergen Reactions  . Codeine Hives  . Contrast Media [Iodinated Diagnostic Agents] Hives    Medications: Scheduled Meds: . aspirin EC  325 mg Oral Daily  . buPROPion  150 mg Oral Daily  . citalopram  40 mg Oral Daily  . gabapentin  300 mg Oral QHS    . irbesartan  37.5 mg Oral Daily  . metoprolol tartrate  12.5 mg Oral BID  . pantoprazole  40 mg Oral Q1200  . simvastatin  20 mg Oral q1800   Continuous Infusions:  PRN Meds:.acetaminophen **OR** acetaminophen, ondansetron **OR** ondansetron (ZOFRAN) IV   Results for orders placed or performed during the hospital encounter of 11/13/15 (from the past 48 hour(s))  Basic metabolic panel     Status: Abnormal   Collection Time: 11/13/15  1:00 PM  Result Value Ref Range   Sodium 137 135 - 145 mmol/L   Potassium 4.1 3.5 - 5.1 mmol/L   Chloride 105 101 - 111 mmol/L   CO2 23 22 - 32 mmol/L   Glucose, Bld 120 (H) 65 - 99 mg/dL   BUN 15 6 - 20 mg/dL   Creatinine, Ser 0.92 0.44 - 1.00 mg/dL   Calcium 8.6 (L) 8.9 - 10.3 mg/dL   GFR calc non Af Amer >60 >60 mL/min   GFR calc Af Amer >60 >60 mL/min    Comment: (NOTE) The eGFR has been calculated using the CKD EPI equation. This calculation has not been validated in all clinical situations. eGFR's persistently <60 mL/min signify possible Chronic Kidney Disease.    Anion gap 9 5 - 15  CBC     Status:  Abnormal   Collection Time: 11/13/15  1:00 PM  Result Value Ref Range   WBC 9.6 4.0 - 10.5 K/uL   RBC 4.35 3.87 - 5.11 MIL/uL   Hemoglobin 11.4 (L) 12.0 - 15.0 g/dL   HCT 36.8 36.0 - 46.0 %   MCV 84.6 78.0 - 100.0 fL   MCH 26.2 26.0 - 34.0 pg   MCHC 31.0 30.0 - 36.0 g/dL   RDW 14.5 11.5 - 15.5 %   Platelets 320 150 - 400 K/uL  Troponin I     Status: None   Collection Time: 11/13/15  1:00 PM  Result Value Ref Range   Troponin I <0.03 <0.031 ng/mL    Comment:        NO INDICATION OF MYOCARDIAL INJURY.   Troponin I (q 6hr x 3)     Status: Abnormal   Collection Time: 11/13/15  7:08 PM  Result Value Ref Range   Troponin I 0.39 (H) <0.031 ng/mL    Comment:        PERSISTENTLY INCREASED TROPONIN VALUES IN THE RANGE OF 0.04-0.49 ng/mL CAN BE SEEN IN:       -UNSTABLE ANGINA       -CONGESTIVE HEART FAILURE       -MYOCARDITIS        -CHEST TRAUMA       -ARRYHTHMIAS       -LATE PRESENTING MYOCARDIAL INFARCTION       -COPD   CLINICAL FOLLOW-UP RECOMMENDED.   Troponin I (q 6hr x 3)     Status: Abnormal   Collection Time: 11/13/15 11:56 PM  Result Value Ref Range   Troponin I 0.27 (H) <0.031 ng/mL    Comment:        PERSISTENTLY INCREASED TROPONIN VALUES IN THE RANGE OF 0.04-0.49 ng/mL CAN BE SEEN IN:       -UNSTABLE ANGINA       -CONGESTIVE HEART FAILURE       -MYOCARDITIS       -CHEST TRAUMA       -ARRYHTHMIAS       -LATE PRESENTING MYOCARDIAL INFARCTION       -COPD   CLINICAL FOLLOW-UP RECOMMENDED.   Lipid panel     Status: Abnormal   Collection Time: 11/13/15 11:56 PM  Result Value Ref Range   Cholesterol 199 0 - 200 mg/dL   Triglycerides 116 <150 mg/dL   HDL 40 (L) >40 mg/dL   Total CHOL/HDL Ratio 5.0 RATIO   VLDL 23 0 - 40 mg/dL   LDL Cholesterol 136 (H) 0 - 99 mg/dL    Comment:        Total Cholesterol/HDL:CHD Risk Coronary Heart Disease Risk Table                     Men   Women  1/2 Average Risk   3.4   3.3  Average Risk       5.0   4.4  2 X Average Risk   9.6   7.1  3 X Average Risk  23.4   11.0        Use the calculated Patient Ratio above and the CHD Risk Table to determine the patient's CHD Risk.        ATP III CLASSIFICATION (LDL):  <100     mg/dL   Optimal  100-129  mg/dL   Near or Above  Optimal  130-159  mg/dL   Borderline  160-189  mg/dL   High  >190     mg/dL   Very High   Troponin I (q 6hr x 3)     Status: Abnormal   Collection Time: 11/14/15  5:45 AM  Result Value Ref Range   Troponin I 0.13 (H) <0.031 ng/mL    Comment:        PERSISTENTLY INCREASED TROPONIN VALUES IN THE RANGE OF 0.04-0.49 ng/mL CAN BE SEEN IN:       -UNSTABLE ANGINA       -CONGESTIVE HEART FAILURE       -MYOCARDITIS       -CHEST TRAUMA       -ARRYHTHMIAS       -LATE PRESENTING MYOCARDIAL INFARCTION       -COPD   CLINICAL FOLLOW-UP RECOMMENDED.   CBC     Status: Abnormal    Collection Time: 11/14/15  5:45 AM  Result Value Ref Range   WBC 8.0 4.0 - 10.5 K/uL   RBC 3.99 3.87 - 5.11 MIL/uL   Hemoglobin 10.8 (L) 12.0 - 15.0 g/dL   HCT 33.7 (L) 36.0 - 46.0 %   MCV 84.5 78.0 - 100.0 fL   MCH 27.1 26.0 - 34.0 pg   MCHC 32.0 30.0 - 36.0 g/dL   RDW 14.5 11.5 - 15.5 %   Platelets 239 150 - 400 K/uL  Comprehensive metabolic panel     Status: Abnormal   Collection Time: 11/14/15  5:45 AM  Result Value Ref Range   Sodium 141 135 - 145 mmol/L   Potassium 4.3 3.5 - 5.1 mmol/L   Chloride 107 101 - 111 mmol/L   CO2 27 22 - 32 mmol/L   Glucose, Bld 114 (H) 65 - 99 mg/dL   BUN 12 6 - 20 mg/dL   Creatinine, Ser 0.80 0.44 - 1.00 mg/dL   Calcium 8.8 (L) 8.9 - 10.3 mg/dL   Total Protein 6.5 6.5 - 8.1 g/dL   Albumin 3.4 (L) 3.5 - 5.0 g/dL   AST 17 15 - 41 U/L   ALT 18 14 - 54 U/L   Alkaline Phosphatase 68 38 - 126 U/L   Total Bilirubin 0.2 (L) 0.3 - 1.2 mg/dL   GFR calc non Af Amer >60 >60 mL/min   GFR calc Af Amer >60 >60 mL/min    Comment: (NOTE) The eGFR has been calculated using the CKD EPI equation. This calculation has not been validated in all clinical situations. eGFR's persistently <60 mL/min signify possible Chronic Kidney Disease.    Anion gap 7 5 - 15    Dg Chest Port 1 View  11/13/2015  CLINICAL DATA:  60 year old former smoker presenting to the emergency department with acute onset of chest pain, shortness of breath and nausea/vomiting which began 30 min prior to emergency department admission. EXAM: PORTABLE CHEST 1 VIEW COMPARISON:  None. FINDINGS: Suboptimal inspiration. Cardiac silhouette normal and mediastinal contours unremarkable for the AP portable technique. Pulmonary parenchyma clear. Bronchovascular markings normal. Pulmonary vascularity normal. No pneumothorax. No visible pleural effusions. IMPRESSION: Suboptimal inspiration.  No acute cardiopulmonary disease. Electronically Signed   By: Evangeline Dakin M.D.   On: 11/13/2015 13:26    Review  of Systems  Constitutional: Negative for fever, chills and diaphoresis.  HENT: Negative for congestion.   Eyes: Positive for double vision.  Respiratory: Positive for shortness of breath. Negative for cough and wheezing.   Cardiovascular: Positive for chest pain and leg  swelling (resolved). Negative for orthopnea.  Gastrointestinal: Positive for nausea and vomiting. Negative for abdominal pain, diarrhea, blood in stool and melena.  Musculoskeletal: Negative for myalgias.  Neurological: Negative for dizziness.  All other systems reviewed and are negative.  Blood pressure 121/65, pulse 67, temperature 97.9 F (36.6 C), temperature source Oral, resp. rate 13, height _0  (1.702 m), weight 244 lb 4.8 oz (110.814 kg), SpO2 98 %. Physical Exam  Nursing note and vitals reviewed.  Obese, well developed, in no acute distress HEENT: Pupils are equal round react to light accommodation extraocular movements are intact.  Neck: no JVDNo cervical lymphadenopathy. Cardiac: Regular rate and rhythm without murmurs rubs or gallops. Lungs:  clear to auscultation bilaterally, no wheezing, rhonchi or rales Abd: soft, nontender, positive bowel sounds all quadrants, no hepatosplenomegaly Ext: no lower extremity edema.  2+ radial and 1+ dorsalis pedis pulses. Skin: warm and dry Neuro:  Grossly normal   Assessment/Plan: Principal Problem:   NSTEMI (non-ST elevated myocardial infarction) ([place]) Active Problems:   Chest pain   Benign essential HTN   Dyslipidemia   Obesity (BMI 35.0-39.9 without comorbidity) ([place])   60 year old obese female with no prior cardiac history but with a history of hypertension, hyperlipidemia, back pain osteoarthritis, anemia and allergies.  She had a left total knee replacement back in October.  Patient presents with chest pain described as tightness radiating to her arms associated with nausea, vomiting, shortness of breath.  EKG shows no acute ischemic changes however, troponin  was negative at first and then peaked at 0.39 at 1708hrs last night.  She was started on aspirin, beta blocker, statin. I have started IV heparin this morning.  I have increased the dose of simvastatin to 40 mg.  Should be scheduled for left heart catheterization  The patient understands that risks include but are not limited to stroke (1 in 1000), death (1 in 1000), kidney failure [usually temporary] (1 in 500), bleeding (1 in 200), allergic reaction [possibly serious] (1 in 200). The patient understands and is willing to proceed.  She will need to be premedicated with prednisone, Benadryl, Pepcid prior to catheterization due to contrast allergy.  She will be hydrated before cath.   Tarri Fuller, [place] 11/14/2015, 9:48 AM     Patient seen and examined. Agree with assessment and plan. Nicole [name] is a 60 year old female who is followed by Dr. Dagmar Hait for primary medical care. Her mother is a patient of Dr. Martinique.  The patient denies any previous known history of coronary artery disease but has a history of hypertension, and hyperlipidemia.  In October 2016 she underwent left total knee replacement surgery without cardiovascular abnormalities.  She is a Actuary in high school. She recently has returned to work.  She admits to some increased work-related stress.  Yesterday, she began to notice an initial chest burning sensation which then evolved into a squeezing tightness discomfort.  She presented to [facility].  Her chest squeezing has resolved but she still notes some intermittent residual chest burning.  Her ECG has been unremarkable.  However, troponins are mildly positive.with her strong family history, worrisome symptomatology and mildly positive serum troponins, I have recommended definitive cardiac catheterization. The risk and benefits of the procedure have been discussed in detail with the patient, as noted above.  Due to potential contrast allergy  will premedicate with solumedrol, Benadryl and Pepcid prior to the catheterization procedure. I reviewed the patient's laboratory.  She has normal renal  function with a GFR greater than 60; stage II, she has a history of hyperlipidemia with increased LDL cholesterol at 136.  She has been on simvastatin, but this may ultimately need to be changed to a high potency statin with target LDL less than 70.  She has mild anemia with a hemoglobin of 10.8 and hematocrit of 33.7.  She has normocytic indices.  She has remote history of gastric bypass surgery. Will hydrate and plan cardiac catheterization later today as schedule allows.  Troy Sine, MD, [facility] 11/14/2015 10:15 AM

## 2015-11-14 NOTE — Interval H&P Note (Signed)
Cath Lab Visit (complete for each Cath Lab visit)  Clinical Evaluation Leading to the Procedure:   ACS: Yes.    Non-ACS:    Anginal Classification: CCS IV  Anti-ischemic medical therapy: No Therapy  Non-Invasive Test Results: No non-invasive testing performed  Prior CABG: No previous CABG      History and Physical Interval Note:  11/14/2015 4:11 PM  Nicole [name]  has presented today for surgery, with the diagnosis of cp  The various methods of treatment have been discussed with the patient and family. After consideration of risks, benefits and other options for treatment, the patient has consented to  Procedure(s): Left Heart Cath and Coronary Angiography (N/A) as a surgical intervention .  The patient's history has been reviewed, patient examined, no change in status, stable for surgery.  I have reviewed the patient's chart and labs.  Questions were answered to the patient's satisfaction.     [name]

## 2015-11-14 NOTE — Progress Notes (Addendum)
[facility] PROGRESS NOTE  Nicole [name] [id number] DOB: 1956-02-24 DOA: 11/13/2015 PCP: Tivis Ringer, MD  Assessment/Plan: Chest pain/NSTEMI -EKG normal  -elevated troponin  -with cardiac risk factors including -HTN, Dyslipidemia, family history of premature CAD in father -after discussing with cardiology, patient initiated on heparin drip and the plan is for cath later today (1/30) -continue ASA and statins -no b-blocker due to bradycardia   Benign essential HTN -stable, benicar changed to Irbesartan (on formulary) -will monitor -BP is stable  Dyslipidemia -LDL 136 -will continue statins; increase dose to 40mg  daily  Mild Iron deficiency anemia -stable, FU with PCP  Depression -continue celexa and Wellbutrin  Obesity -Body mass index is 38.25 kg/(m^2). -advise to follow low calorie diet and increase physical activity  Code Status:  Family Communication: no family at bedside  Disposition Plan: remains in the hospital; started on IV heparin as per cardiology rec's; will have left heart cath.   Consultants:  Cardiology   Procedures:  Left heart cath planned for later today or tomorrow (1/30--1/31)  Antibiotics:  None   HPI/Subjective: Afebrile, no nausea or vomiting. Patient still with chest pressure and some burning sensation in substernal area.  Objective: Filed Vitals:   11/14/15 0500 11/14/15 1412  BP: 121/65 109/63  Pulse: 67 54  Temp: 97.9 F (36.6 C) 98.3 F (36.8 C)  Resp:  15    Intake/Output Summary (Last 24 hours) at 11/14/15 1528 Last data filed at 11/14/15 1319  Gross per 24 hour  Intake 240.93 ml  Output      0 ml  Net 240.93 ml   Filed Weights   11/13/15 1256 11/13/15 1624 11/14/15 0500  Weight: 111.131 kg (245 lb) 110.95 kg (244 lb 9.6 oz) 110.814 kg (244 lb 4.8 oz)    Exam:   General: afebrile, complaining of some mid chest pressure and burning sensation, no nausea, no vomiting.  Cardiovascular: S1 and  S2, no rubs or gallops  Respiratory: good air movement, no wheezing, no crackles   Abdomen: soft, NT, ND, positive BS  Musculoskeletal: no edema, no cyanosis   Data Reviewed: Basic Metabolic Panel:  Recent Labs Lab 11/13/15 1300 11/14/15 0545  NA 137 141  K 4.1 4.3  CL 105 107  CO2 23 27  GLUCOSE 120* 114*  BUN 15 12  CREATININE 0.92 0.80  CALCIUM 8.6* 8.8*   Liver Function Tests:  Recent Labs Lab 11/14/15 0545  AST 17  ALT 18  ALKPHOS 68  BILITOT 0.2*  PROT 6.5  ALBUMIN 3.4*   CBC:  Recent Labs Lab 11/13/15 1300 11/14/15 0545  WBC 9.6 8.0  HGB 11.4* 10.8*  HCT 36.8 33.7*  MCV 84.6 84.5  PLT 320 239   Cardiac Enzymes:  Recent Labs Lab 11/13/15 1300 11/13/15 1908 11/13/15 2356 11/14/15 0545  TROPONINI <0.03 0.39* 0.27* 0.13*   Studies: Dg Chest Port 1 View  11/13/2015  CLINICAL DATA:  60 year old former smoker presenting to the emergency department with acute onset of chest pain, shortness of breath and nausea/vomiting which began 30 min prior to emergency department admission. EXAM: PORTABLE CHEST 1 VIEW COMPARISON:  None. FINDINGS: Suboptimal inspiration. Cardiac silhouette normal and mediastinal contours unremarkable for the AP portable technique. Pulmonary parenchyma clear. Bronchovascular markings normal. Pulmonary vascularity normal. No pneumothorax. No visible pleural effusions. IMPRESSION: Suboptimal inspiration.  No acute cardiopulmonary disease. Electronically Signed   By: Evangeline Dakin M.D.   On: 11/13/2015 13:26    Scheduled Meds: . aspirin  81 mg Oral  Pre-Cath  . aspirin EC  325 mg Oral Daily  . buPROPion  150 mg Oral Daily  . citalopram  40 mg Oral Daily  . gabapentin  300 mg Oral QHS  . irbesartan  37.5 mg Oral Daily  . metoprolol tartrate  12.5 mg Oral BID  . pantoprazole  40 mg Oral Q1200  . simvastatin  40 mg Oral q1800  . sodium chloride flush  3 mL Intravenous Q12H   Continuous Infusions: . sodium chloride 100 mL/hr at  11/14/15 1056  . heparin 1,300 Units/hr (11/14/15 1131)    Principal Problem:   NSTEMI (non-ST elevated myocardial infarction) ([place]) Active Problems:   Chest pain   Benign essential HTN   Dyslipidemia   Obesity (BMI 35.0-39.9 without comorbidity) ([place])    Time spent: 30 minutes   Barton Dubois  [facility] Pager [phone]. If 7PM-7AM, please contact night-coverage at [email or web address], password [facility] 11/14/2015, 3:28 PM

## 2015-11-15 ENCOUNTER — Encounter (HOSPITAL_COMMUNITY): Payer: Self-pay | Admitting: Cardiovascular Disease

## 2015-11-15 DIAGNOSIS — E785 Hyperlipidemia, unspecified: Secondary | ICD-10-CM | POA: Diagnosis not present

## 2015-11-15 DIAGNOSIS — I10 Essential (primary) hypertension: Secondary | ICD-10-CM | POA: Diagnosis not present

## 2015-11-15 DIAGNOSIS — E669 Obesity, unspecified: Secondary | ICD-10-CM | POA: Diagnosis not present

## 2015-11-15 DIAGNOSIS — R079 Chest pain, unspecified: Secondary | ICD-10-CM | POA: Diagnosis not present

## 2015-11-15 DIAGNOSIS — I214 Non-ST elevation (NSTEMI) myocardial infarction: Secondary | ICD-10-CM | POA: Diagnosis not present

## 2015-11-15 DIAGNOSIS — K219 Gastro-esophageal reflux disease without esophagitis: Secondary | ICD-10-CM

## 2015-11-15 LAB — BASIC METABOLIC PANEL
Anion gap: 8 (ref 5–15)
BUN: 10 mg/dL (ref 6–20)
CO2: 25 mmol/L (ref 22–32)
CREATININE: 0.8 mg/dL (ref 0.44–1.00)
Calcium: 9 mg/dL (ref 8.9–10.3)
Chloride: 106 mmol/L (ref 101–111)
Glucose, Bld: 132 mg/dL — ABNORMAL HIGH (ref 65–99)
POTASSIUM: 4.1 mmol/L (ref 3.5–5.1)
SODIUM: 139 mmol/L (ref 135–145)

## 2015-11-15 LAB — CBC
HCT: 34.2 % — ABNORMAL LOW (ref 36.0–46.0)
HEMOGLOBIN: 10.7 g/dL — AB (ref 12.0–15.0)
MCH: 26 pg (ref 26.0–34.0)
MCHC: 31.3 g/dL (ref 30.0–36.0)
MCV: 83 fL (ref 78.0–100.0)
PLATELETS: 281 10*3/uL (ref 150–400)
RBC: 4.12 MIL/uL (ref 3.87–5.11)
RDW: 14.2 % (ref 11.5–15.5)
WBC: 10.4 10*3/uL (ref 4.0–10.5)

## 2015-11-15 MED ORDER — ALUM & MAG HYDROXIDE-SIMETH 200-200-20 MG/5ML PO SUSP
30.0000 mL | Freq: Four times a day (QID) | ORAL | Status: DC | PRN
  Administered 2015-11-15: 30 mL via ORAL
  Filled 2015-11-15: qty 30

## 2015-11-15 MED ORDER — PANTOPRAZOLE SODIUM 40 MG PO TBEC: 40.0000 mg | DELAYED_RELEASE_TABLET | Freq: Two times a day (BID) | ORAL | Status: DC

## 2015-11-15 MED ORDER — ATORVASTATIN CALCIUM 40 MG PO TABS: 40.0000 mg | ORAL_TABLET | Freq: Every day | ORAL | Status: DC

## 2015-11-15 NOTE — Progress Notes (Signed)
Patient discharge paperwork gone over in detail with patient as well as new medications. All questions answered to patient satisfaction. Telemetry discontinued, IV removed intact. Patient discharged to home with family by way of wheelchair.

## 2015-11-15 NOTE — Progress Notes (Signed)
Patient Name: Nicole [name] Date of Encounter: 11/15/2015   SUBJECTIVE  Feeling well. No chest pain, sob or palpitations.   CURRENT MEDS . aspirin  81 mg Oral Daily  . atorvastatin  80 mg Oral q1800  . buPROPion  150 mg Oral Daily  . citalopram  40 mg Oral Daily  . gabapentin  300 mg Oral QHS  . irbesartan  37.5 mg Oral Daily  . metoprolol tartrate  12.5 mg Oral BID  . pantoprazole  40 mg Oral Q1200  . sodium chloride flush  3 mL Intravenous Q12H    OBJECTIVE  Filed Vitals:   11/14/15 2100 11/14/15 2139 11/15/15 0500 11/15/15 1024  BP: 113/61 126/60 98/56 105/65  Pulse: 82 78 83 64  Temp:  98.1 F (36.7 C) 98.3 F (36.8 C)   TempSrc:  Oral Oral   Resp:   18   Height:      Weight:   244 lb 3.2 oz (110.768 kg)   SpO2: 100% 98% 99% 97%    Intake/Output Summary (Last 24 hours) at 11/15/15 1226 Last data filed at 11/15/15 0820  Gross per 24 hour  Intake 2759.62 ml  Output   1025 ml  Net 1734.62 ml   Filed Weights   11/13/15 1624 11/14/15 0500 11/15/15 0500  Weight: 244 lb 9.6 oz (110.95 kg) 244 lb 4.8 oz (110.814 kg) 244 lb 3.2 oz (110.768 kg)    PHYSICAL EXAM  General: Pleasant, NAD. Neuro: Alert and oriented X 3. Moves all extremities spontaneously. Psych: Normal affect. HEENT:  Normal  Neck: Supple without bruits or JVD. Lungs:  Resp regular and unlabored, CTA. Heart: RRR no s3, s4, or murmurs. Abdomen: Soft, non-tender, non-distended, BS + x 4.  Extremities: No clubbing, cyanosis or edema. DP/PT/Radials 2+ and equal bilaterally. R radial cath site without hematoma or bruit.   Accessory Clinical Findings  CBC  Recent Labs  11/14/15 0545 11/15/15 0652  WBC 8.0 10.4  HGB 10.8* 10.7*  HCT 33.7* 34.2*  MCV 84.5 83.0  PLT 239 [id number]   Basic Metabolic Panel  Recent Labs  11/14/15 0545 11/15/15 0652  NA 141 139  K 4.3 4.1  CL 107 106  CO2 27 25  GLUCOSE 114* 132*  BUN 12 10  CREATININE 0.80 0.80  CALCIUM 8.8* 9.0   Liver Function  Tests  Recent Labs  11/14/15 0545  AST 17  ALT 18  ALKPHOS 68  BILITOT 0.2*  PROT 6.5  ALBUMIN 3.4*   No results for input(s): LIPASE, AMYLASE in the last 72 hours. Cardiac Enzymes  Recent Labs  11/13/15 1908 11/13/15 2356 11/14/15 0545  TROPONINI 0.39* 0.27* 0.13*   Fasting Lipid Panel  Recent Labs  11/13/15 2356  CHOL 199  HDL 40*  LDLCALC 136*  TRIG 116  CHOLHDL 5.0   TELE  nsr Cath  History obtained from chart review. [name] is a 60 year old moderately overweight female with history of hypertension and hyperlipidemia who presented with chest pain and mildly elevated troponins. She presents for cardiac catheterization to define her anatomy   IMPRESSION:Mrs. Wearing had normal coronary arteries and normal LV function.I believe her chest pain was noncardiac.She did have mildly elevated troponins of unclear etiology.The sheath was removed and a TR band was placed on the right wrist to achieve patent hemostasis. The patient left the lab in stable condition Radiology/Studies  Dg Chest Port 1 View  11/13/2015  CLINICAL DATA:  60 year old former smoker presenting to the emergency department  with acute onset of chest pain, shortness of breath and nausea/vomiting which began 30 min prior to emergency department admission. EXAM: PORTABLE CHEST 1 VIEW COMPARISON:  None. FINDINGS: Suboptimal inspiration. Cardiac silhouette normal and mediastinal contours unremarkable for the AP portable technique. Pulmonary parenchyma clear. Bronchovascular markings normal. Pulmonary vascularity normal. No pneumothorax. No visible pleural effusions. IMPRESSION: Suboptimal inspiration.  No acute cardiopulmonary disease. Electronically Signed   By: Evangeline Dakin M.D.   On: 11/13/2015 13:26    ASSESSMENT AND PLAN  1. NSTEMI (non-ST elevated myocardial infarction) ([facility]) - Troponin trending down. Cath showed normal coronaries. Unclear etiology. Chest pain has been resolved.  - continue  ASA, statin, BB and ARB. MD to decided further dose./   2. Dyslipidemia - 11/13/2015: Cholesterol 199; HDL 40*; LDL Cholesterol 136*; Triglycerides 116; VLDL 23  - Continue statin. Consider reducing dose. Will need LFT check in 4-6 weeks with repeat lipid panel.  3. HTN - BP stable and well controlled.   4. Obesity (BMI 35.0-39.9 without comorbidity) ([place]) - Will benefit from lifestyle modification. Education given  Plan: ok to discharge. F/u with PCP or one time cardiology f/u. Will discuss with MD.    Jarrett Soho PA-C Pager [phone]   Patient seen and examined. Agree with assessment and plan.  The patient feels well and is without recurrent chest discomfort.  Coronary angiography of the epicardial coronary arteries revealed normal coronary anatomy.  It is possible that her troponin elevation may have been due to transient spasm versus microvascular CAD.  She had been on low-dose simvastatin and LDL was increased to 136. She is now on atorvastatin as a higher potency statin and consider either 40 or 80 mg.  The patient can be discharged today with plans for follow-up evaluation with her primary physician Dr. Dagmar Hait.   Troy Sine, MD, [facility] 11/15/2015 1:18 PM

## 2015-11-15 NOTE — Plan of Care (Signed)
Problem: Phase I Progression Outcomes Goal: Pain controlled with appropriate interventions Outcome: Not Applicable Date Met:  75/91/63 Patient has denied pain thus far this shift.

## 2015-11-15 NOTE — Plan of Care (Signed)
Problem: Bowel/Gastric: Goal: Will not experience complications related to bowel motility Outcome: Completed/Met Date Met:  11/15/15 Patient reports having normal bowel movements since being admitted to the hospital.

## 2015-11-15 NOTE — Discharge Summary (Signed)
Physician Discharge Summary  ETHELWYN BUOL [id number] DOB: 12-Jul-1956 DOA: 11/13/2015  PCP: Tivis Ringer, MD  Admit date: 11/13/2015 Discharge date: 11/15/2015  Time spent: 35 minutes  Recommendations for Outpatient Follow-up:  1. Reassess BP and adjust antihypertensive regimen as needed 2. Please follow LFT's and lipid panel in 6 weeks; statins dose increase as per cardiology rec's   Discharge Diagnoses:  Principal Problem:   NSTEMI (non-ST elevated myocardial infarction) [facility]) Active Problems:   Chest pain   Benign essential HTN   Dyslipidemia   Obesity (BMI 35.0-39.9 without comorbidity) ([place]) GERD   Discharge Condition: stable and improved. Discharge home with instructions to follow up with PCP in 2 weeks.  Diet recommendation: heart healthy and low calorie diet   Filed Weights   11/13/15 1624 11/14/15 0500 11/15/15 0500  Weight: 110.95 kg (244 lb 9.6 oz) 110.814 kg (244 lb 4.8 oz) 110.768 kg (244 lb 3.2 oz)    History of present illness:  60 y.o. female with PMH of HTN, Dyslipidemia, H/o gastric bypass surgery, mild iron deficiency anemia presents to the ER with the above complaints. Pt reports being in her usual state of health this morning, she went out to have brunch with her daughter, she had pancake, eggs, coffee etc and then they left the restaurant, on the way home she started experiencing this burning pain across her chest, then it moved lower down to the substernal area and there was a squeezing chest pain/discomfort, associated with some shortness of breath and nausea. This radiated to both her arms down to her elbows. Her daughter then took her to [facility] in [place]. In ED, Vitals, EKG, CXR, troponin unremarkable, TRH consulted for admission/observation  Hospital Course:  Chest pain/NSTEMI -EKG normal  -elevated troponin  -with cardiac risk factors including -HTN, Dyslipidemia, family history of premature CAD in father -after discussing  with cardiology, patient was initiated on heparin drip and taken for heart cath; which demonstrated normal CAD and preserved EF. -presumed secondary to transient CAD spasm -will continue ASA and statins -no b-blocker due to bradycardia  -patient started on PPI for GERD  Benign essential HTN -stable -will continue home antihypertensive regimen  -patient advise to follow heart healthy diet   Dyslipidemia -LDL 136 -will continue statins; but medication changed to lipitor 40 mg daily as per cardiology rec's  Mild Iron deficiency anemia -stable, FU with PCP  Depression -continue celexa and Wellbutrin  GERD -will treat with PPI  Obesity -Body mass index is 38.25 kg/(m^2). -advise to follow low calorie diet and increase physical activity   Procedures:  Left heart cath 1/30 -Mrs. Reeg had normal coronary arteries and normal LV function.I believe her chest pain was noncardiac.She did have mildly elevated troponins of unclear etiology (presumed transient CAD spasm)  Consultations:  Cardiology   Discharge Exam: Filed Vitals:   11/15/15 0500 11/15/15 1024  BP: 98/56 105/65  Pulse: 83 64  Temp: 98.3 F (36.8 C)   Resp: 18     General: afebrile, no CP and no SOB today. Patient also denies nausea and vomiting.  Cardiovascular: S1 and S2, no rubs or gallops  Respiratory: good air movement, no wheezing, no crackles   Abdomen: soft, NT, ND, positive BS  Musculoskeletal: no edema, no cyanosis  Discharge Instructions   Discharge Instructions    Diet - low sodium heart healthy    Complete by:  As directed      Discharge instructions    Complete by:  As directed  Follow heart healthy and low calorie diet Arrange follow up with PCP in 2 weeks Keep yourself well hydrated Take medications as prescribed          Current Discharge Medication List    START taking these medications   Details  atorvastatin (LIPITOR) 40 MG tablet Take 1 tablet (40 mg total) by mouth  daily at 6 PM. Qty: 30 tablet, Refills: 1    pantoprazole (PROTONIX) 40 MG tablet Take 1 tablet (40 mg total) by mouth 2 (two) times daily. Qty: 60 tablet, Refills: 1      CONTINUE these medications which have NOT CHANGED   Details  aspirin EC 325 MG EC tablet 1 tab a day for the next 30 days to prevent blood clots Qty: 30 tablet, Refills: 0    BENICAR 40 MG tablet Take 20 mg by mouth daily. Refills: 6    buPROPion (WELLBUTRIN XL) 150 MG 24 hr tablet Take 150 mg by mouth daily. Refills: 6    cetirizine (ZYRTEC) 10 MG tablet Take 10 mg by mouth daily.    citalopram (CELEXA) 40 MG tablet Take 40 mg by mouth daily. Refills: 6    fluticasone (FLONASE) 50 MCG/ACT nasal spray Place 2 sprays into both nostrils daily as needed for allergies or rhinitis.  Refills: 11    gabapentin (NEURONTIN) 300 MG capsule Take 300 mg by mouth at bedtime.  Refills: 3    meloxicam (MOBIC) 15 MG tablet Take 15 mg by mouth daily. Refills: 6    docusate sodium (COLACE) 100 MG capsule 1 tab 2 times a day while on narcotics.  STOOL SOFTENER Qty: 60 capsule, Refills: 0    polyethylene glycol (MIRALAX / GLYCOLAX) packet 17grams in 16 oz of water twice a day until bowel movement.  LAXITIVE.  Restart if two days since last bowel movement Qty: 60 each, Refills: 0      STOP taking these medications     amoxicillin-clavulanate (AUGMENTIN) 875-125 MG tablet      celecoxib (CELEBREX) 200 MG capsule      oxyCODONE (OXY IR/ROXICODONE) 5 MG immediate release tablet      OxyCODONE (OXYCONTIN) 20 mg T12A 12 hr tablet        Allergies  Allergen Reactions  . Codeine Hives  . Contrast Media [Iodinated Diagnostic Agents] Hives    The results of significant diagnostics from this hospitalization (including imaging, microbiology, ancillary and laboratory) are listed below for reference.    Significant Diagnostic Studies: Dg Chest Port 1 View  11/13/2015  CLINICAL DATA:  61 year old former smoker presenting  to the emergency department with acute onset of chest pain, shortness of breath and nausea/vomiting which began 30 min prior to emergency department admission. EXAM: PORTABLE CHEST 1 VIEW COMPARISON:  None. FINDINGS: Suboptimal inspiration. Cardiac silhouette normal and mediastinal contours unremarkable for the AP portable technique. Pulmonary parenchyma clear. Bronchovascular markings normal. Pulmonary vascularity normal. No pneumothorax. No visible pleural effusions. IMPRESSION: Suboptimal inspiration.  No acute cardiopulmonary disease. Electronically Signed   By: Evangeline Dakin M.D.   On: 11/13/2015 13:26   Labs: Basic Metabolic Panel:  Recent Labs Lab 11/13/15 1300 11/14/15 0545 11/15/15 0652  NA 137 141 139  K 4.1 4.3 4.1  CL 105 107 106  CO2 23 27 25   GLUCOSE 120* 114* 132*  BUN 15 12 10   CREATININE 0.92 0.80 0.80  CALCIUM 8.6* 8.8* 9.0   Liver Function Tests:  Recent Labs Lab 11/14/15 0545  AST 17  ALT 18  ALKPHOS 68  BILITOT 0.2*  PROT 6.5  ALBUMIN 3.4*   CBC:  Recent Labs Lab 11/13/15 1300 11/14/15 0545 11/15/15 0652  WBC 9.6 8.0 10.4  HGB 11.4* 10.8* 10.7*  HCT 36.8 33.7* 34.2*  MCV 84.6 84.5 83.0  PLT 320 239 281   Cardiac Enzymes:  Recent Labs Lab 11/13/15 1300 11/13/15 1908 11/13/15 2356 11/14/15 0545  TROPONINI <0.03 0.39* 0.27* 0.13*    Signed:  Barton Dubois MD.  [facility] 11/15/2015, 3:20 PM

## 2015-11-17 DIAGNOSIS — K219 Gastro-esophageal reflux disease without esophagitis: Secondary | ICD-10-CM | POA: Insufficient documentation

## 2015-11-21 ENCOUNTER — Other Ambulatory Visit: Payer: Self-pay | Admitting: Internal Medicine

## 2015-11-21 DIAGNOSIS — K219 Gastro-esophageal reflux disease without esophagitis: Secondary | ICD-10-CM

## 2015-11-23 ENCOUNTER — Ambulatory Visit
Admission: RE | Admit: 2015-11-23 | Discharge: 2015-11-23 | Disposition: A | Discharge status: Home / Self Care | Payer: BC Managed Care – PPO | Source: Ambulatory Visit | Attending: Internal Medicine | Admitting: Internal Medicine

## 2015-11-23 DIAGNOSIS — K219 Gastro-esophageal reflux disease without esophagitis: Secondary | ICD-10-CM

## 2015-12-27 ENCOUNTER — Encounter: Payer: Self-pay | Admitting: Internal Medicine

## 2015-12-27 ENCOUNTER — Ambulatory Visit (INDEPENDENT_AMBULATORY_CARE_PROVIDER_SITE_OTHER): Payer: BC Managed Care – PPO | Admitting: Internal Medicine

## 2015-12-27 VITALS — BP 108/70 | HR 68 | Ht 67.0 in | Wt 245.0 lb

## 2015-12-27 DIAGNOSIS — R197 Diarrhea, unspecified: Secondary | ICD-10-CM | POA: Diagnosis not present

## 2015-12-27 DIAGNOSIS — R1310 Dysphagia, unspecified: Secondary | ICD-10-CM

## 2015-12-27 DIAGNOSIS — R1013 Epigastric pain: Secondary | ICD-10-CM

## 2015-12-27 DIAGNOSIS — R10816 Epigastric abdominal tenderness: Secondary | ICD-10-CM

## 2015-12-27 NOTE — Patient Instructions (Addendum)
  You have been scheduled for a CT scan of the abdomen and pelvis at [facility] [address]  You are scheduled on 01/02/16 at 4:30pm. You should arrive 15 minutes prior to your appointment time for registration. Please follow the written instructions below on the day of your exam:  WARNING: IF YOU ARE ALLERGIC TO IODINE/X-RAY DYE, PLEASE NOTIFY RADIOLOGY IMMEDIATELY AT [phone]! YOU WILL BE GIVEN A 13 HOUR PREMEDICATION PREP.  1) Do not eat  anything after 12:30pm (4 hours prior to your test) 2) You have been given 2 bottles of oral contrast to drink. The solution may taste    better if refrigerated, but do NOT add ice or any other liquid to this solution. Shake well before drinking.    Drink 1 bottle of contrast @ 2:30pm (2 hours prior to your exam)  Drink 1 bottle of contrast @ 3:30pm (1 hour prior to your exam)  The purpose of you drinking the oral contrast is to aid in the visualization of your intestinal tract. The contrast solution may cause some diarrhea. Before your exam is started, you will be given a small amount of fluid to drink. Depending on your individual set of symptoms, you may also receive an intravenous injection of x-ray contrast/dye. Plan on being at [facility] for 30 minutes or longer, depending on the type of exam you are having performed.  This test typically takes 30-45 minutes to complete.  If you have any questions regarding your exam or if you need to reschedule, you may call the CT department at [phone] between the hours of 8:00 am and 5:00 pm, Monday-Friday.  Patient will go there and get their contrast media and an rx for the pre-meds she will need with her contrast media allergy. ________________________________________________________________________  I appreciate the opportunity to care for you.

## 2015-12-27 NOTE — Progress Notes (Addendum)
Referred by: Prince Solian, MD  Subjective:    Patient ID: Nicole [name], female    DOB: 27-Jun-1956, 60 y.o.   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 Lately she has been having some problems with intermittent epigastric pain abdominal distention and diarrhea. She went on a cruise in February, and things actually got better. She had an upper GI series which was unrevealing though it did show a small hiatal hernia.. Prior to that grew she restricted her diet was on soft foods etc. She has been hospitalized with chest pain with a negative cardiac evaluation including artery angiogram though they did attribute her pain to coronary spasm I don't see any evidence of EKG changes etc. She had a positive troponin.. She does describe intermittent spasms and difficulty swallowing. Last week she says her abdomen blew up she wasr distended and had a lot of pain and bloating but no diarrhea. The diarrhea problems she was having seem to be better. She has not experienced problems like this before. She's never had a colonoscopy. I don't think she's had an upper endoscopy either though she did have a workup with pH probe and manometry prior to her fundoplication. She is on Protonix at this point and thinks it might help some. She takes Mobitz frequently as well. Wt Readings from Last 3 Encounters:  12/27/15 245 lb (111.131 kg)  11/15/15 244 lb 3.2 oz (110.768 kg)  10/21/15 252 lb 6.4 oz (114.488 kg)  She has a history of iron deficiency anemia in the past she is intolerant to oral and forms of her enteral iron. Medications, allergies, past medical history, past surgical history, family history and social history are reviewed and updated in the EMR.   Review of Systems As per history of present illness. So have some joint pains mild edema in the legs or ankles, allergies and  recent cough. All other review of systems are negative.    Objective:   Physical Exam @BP  108/70 mmHg  Pulse 68  Ht 5\' 7"  (1.702 m)  Wt 245 lb (111.131 kg)  BMI 38.36 kg/m2@  General:  Well-developed, well-nourished and in no acute distress Eyes:  anicteric. ENT:   Mouth and posterior pharynx free of lesions.  Neck:   supple w/o thyromegaly or mass.  Lungs: Clear to auscultation bilaterally. Heart:  S1S2, no rubs, murmurs, gallops. Abdomen:  obese soft,tender to deep palpation in epigastrium, w/ slight guarding and no pain with mm tension, no hepatosplenomegaly, hernia, or mass and BS+.  Lymph:  no cervical or supraclavicular adenopathy. Extremities:   no edema, cyanosis or clubbing Skin   no rash. Neuro:  A&O x 3.  Psych:  appropriate mood and  Affect.   Data Reviewed: I reviewed the upper GI films. Reviewed her primary care notes from February. She had labs on 26. Normal liver chemistries kidney function electrolytes. White count was 13. Hemoglobin 11.6 just slightly low.       Assessment & Plan:  Abdominal pain, epigastric  Epigastric abdominal tenderness  Diarrhea, unspecified type  Dysphagia   100% sure what going on here. However 1 thing to be concerned about in a patient status post gastric bypass surgery is an internal hernia so a CT scan of the abdomen is ordered. I had to get approval for this by calling the insurance company. I have discussed that with Dr. Hassell Done as well.  That is unrevealing and EGD will be undertaken. Some other considerations would be even though she's on a PPI she could've some sort of ulcer disease not picked up on the upper GI see she takes Mobic regularly. She takes Benicar or olmesartan, and there is a celiac like disease that can occur with that though in my experience the diarrhea is more persistent. In some way she is improved but she is clearly tender and had these intermittent bloating episodes still.  Depending upon these results  she could need a manometry given her chest symptoms. I find it interesting though maybe there was information I didn't see that her chest pain and angina equivalent is thought to be spasm though she did not EKG changes. Maybe they dismissed those i.e. occurred prehospital. She did have a positive troponin so technically she had a non-STEMI.  I appreciate the opportunity to care for this patient.  CC: Tivis Ringer, MD Dyanne [name].D.

## 2015-12-29 ENCOUNTER — Encounter: Payer: Self-pay | Admitting: Internal Medicine

## 2015-12-29 ENCOUNTER — Telehealth: Payer: Self-pay | Admitting: Internal Medicine

## 2015-12-29 NOTE — Telephone Encounter (Signed)
Called [facility] and changed the CT to abdomen only no pelvis.

## 2015-12-29 NOTE — Telephone Encounter (Signed)
I spoke to Dr. Hazle Nordmann who does preauthorization for [employer] which essentially referred to 4 preauthorization for her CT scan of the abdomen and pelvis. I received approval for CT of the abdomen because her symptoms were in the upper gut. I indicated that though less likely pelvic CT would be reasonable to do because of potential for referred pain but since her symptoms are epigastric pain and tenderness, I think is reasonable to proceed with just a CT of the abdomen. If we need to do the pelvis will have to do a pelvic ultrasound to rule something out their first. per the physician reviewer I spoke to.  Authorization number is [id number].  We will change the study to a CT of the abdomen and canceled the pelvis.

## 2015-12-29 NOTE — Telephone Encounter (Signed)
Ct pelvis just need to be cx  She was approved for ct abd Josem Kaufmann [id number]

## 2016-01-02 ENCOUNTER — Ambulatory Visit
Admission: RE | Admit: 2016-01-02 | Discharge: 2016-01-02 | Disposition: A | Discharge status: Home / Self Care | Payer: BC Managed Care – PPO | Source: Ambulatory Visit | Attending: Internal Medicine | Admitting: Internal Medicine

## 2016-01-02 DIAGNOSIS — R10816 Epigastric abdominal tenderness: Secondary | ICD-10-CM

## 2016-01-02 DIAGNOSIS — R197 Diarrhea, unspecified: Secondary | ICD-10-CM

## 2016-01-02 DIAGNOSIS — R1013 Epigastric pain: Secondary | ICD-10-CM

## 2016-01-02 MED ORDER — IOPAMIDOL (ISOVUE-300) INJECTION 61%
100.0000 mL | Freq: Once | INTRAVENOUS | Status: AC | PRN
  Administered 2016-01-02: 100 mL via INTRAVENOUS

## 2016-01-03 NOTE — Progress Notes (Signed)
Quick Note:  CT does not give any clear answers so needs EGD dx epigastric pain, dysphagia and diarrhea ______

## 2016-01-11 ENCOUNTER — Telehealth: Payer: Self-pay | Admitting: *Deleted

## 2016-01-11 ENCOUNTER — Other Ambulatory Visit: Payer: Self-pay | Admitting: Internal Medicine

## 2016-01-11 DIAGNOSIS — R1310 Dysphagia, unspecified: Secondary | ICD-10-CM

## 2016-01-11 DIAGNOSIS — R197 Diarrhea, unspecified: Secondary | ICD-10-CM

## 2016-01-11 DIAGNOSIS — R1013 Epigastric pain: Secondary | ICD-10-CM

## 2016-01-11 NOTE — Telephone Encounter (Signed)
Spoke with Nicole [name] and we will cancel her EGD for the lec and set her up at [facility] for Dr Celesta Aver hospital week.  Have set her EGD/pos Dil up for 02/02/16 at 10:45AM.  She has pre-visit appt 01/12/16 to discuss instructions.

## 2016-01-11 NOTE — Telephone Encounter (Signed)
Dr. Carlean Purl and Jenny Reichmann,  This patient was seen in the hospital 11/13/15 for chest pain, 11/14/15 had normal angiogram. She did have elevated troponin (as you noted Dr. Carlean Purl) and so is considered to have Non-STEMI. Per our ASA Guidelines, we do not do procedures at [facility] with history of AMI less than 3 months. Less than 6 months need cardiac clearance. Please advise if pt okay for LEC as scheduled or does she need a hospital procedure to be scheduled?  Thank you. Olivia Mackie

## 2016-01-11 NOTE — Telephone Encounter (Signed)
We should do her at hospital - I tried to help myself remember that NSTEMI but didn't. I will cc PJ also to set up

## 2016-01-12 ENCOUNTER — Ambulatory Visit (AMBULATORY_SURGERY_CENTER): Payer: Self-pay | Admitting: *Deleted

## 2016-01-12 VITALS — Ht 67.0 in | Wt 248.4 lb

## 2016-01-12 DIAGNOSIS — R1013 Epigastric pain: Secondary | ICD-10-CM

## 2016-01-12 DIAGNOSIS — R1310 Dysphagia, unspecified: Secondary | ICD-10-CM

## 2016-01-12 DIAGNOSIS — R197 Diarrhea, unspecified: Secondary | ICD-10-CM

## 2016-01-12 NOTE — Progress Notes (Signed)
Patient denies any allergies to egg or soy products. Patient denies complications with anesthesia/sedation.  Patient denies oxygen use at home and denies diet medications. Emmi instructions for endoscopy explained and patient denied.

## 2016-01-25 ENCOUNTER — Encounter: Payer: BC Managed Care – PPO | Admitting: Internal Medicine

## 2016-01-31 ENCOUNTER — Encounter (HOSPITAL_COMMUNITY): Payer: Self-pay | Admitting: *Deleted

## 2016-02-02 ENCOUNTER — Encounter (HOSPITAL_COMMUNITY): Payer: Self-pay

## 2016-02-02 ENCOUNTER — Ambulatory Visit (HOSPITAL_COMMUNITY)
Admission: RE | Admit: 2016-02-02 | Discharge: 2016-02-02 | Disposition: A | Discharge status: Home / Self Care | Payer: BC Managed Care – PPO | Source: Ambulatory Visit | Attending: Internal Medicine | Admitting: Internal Medicine

## 2016-02-02 ENCOUNTER — Ambulatory Visit (HOSPITAL_COMMUNITY): Payer: BC Managed Care – PPO | Admitting: Anesthesiology

## 2016-02-02 ENCOUNTER — Encounter (HOSPITAL_COMMUNITY)
Admission: RE | Disposition: A | Discharge status: Home / Self Care | Payer: Self-pay | Source: Ambulatory Visit | Attending: Internal Medicine

## 2016-02-02 DIAGNOSIS — D649 Anemia, unspecified: Secondary | ICD-10-CM | POA: Diagnosis not present

## 2016-02-02 DIAGNOSIS — R1013 Epigastric pain: Secondary | ICD-10-CM | POA: Insufficient documentation

## 2016-02-02 DIAGNOSIS — R197 Diarrhea, unspecified: Secondary | ICD-10-CM

## 2016-02-02 DIAGNOSIS — Z96651 Presence of right artificial knee joint: Secondary | ICD-10-CM | POA: Insufficient documentation

## 2016-02-02 DIAGNOSIS — E669 Obesity, unspecified: Secondary | ICD-10-CM | POA: Insufficient documentation

## 2016-02-02 DIAGNOSIS — K219 Gastro-esophageal reflux disease without esophagitis: Secondary | ICD-10-CM | POA: Diagnosis not present

## 2016-02-02 DIAGNOSIS — Z6838 Body mass index (BMI) 38.0-38.9, adult: Secondary | ICD-10-CM | POA: Diagnosis not present

## 2016-02-02 DIAGNOSIS — R1310 Dysphagia, unspecified: Secondary | ICD-10-CM

## 2016-02-02 DIAGNOSIS — I10 Essential (primary) hypertension: Secondary | ICD-10-CM | POA: Diagnosis not present

## 2016-02-02 DIAGNOSIS — Z9884 Bariatric surgery status: Secondary | ICD-10-CM | POA: Diagnosis not present

## 2016-02-02 DIAGNOSIS — Z791 Long term (current) use of non-steroidal anti-inflammatories (NSAID): Secondary | ICD-10-CM | POA: Insufficient documentation

## 2016-02-02 DIAGNOSIS — F329 Major depressive disorder, single episode, unspecified: Secondary | ICD-10-CM | POA: Insufficient documentation

## 2016-02-02 DIAGNOSIS — M159 Polyosteoarthritis, unspecified: Secondary | ICD-10-CM | POA: Insufficient documentation

## 2016-02-02 DIAGNOSIS — Z79899 Other long term (current) drug therapy: Secondary | ICD-10-CM | POA: Diagnosis not present

## 2016-02-02 DIAGNOSIS — I252 Old myocardial infarction: Secondary | ICD-10-CM | POA: Diagnosis not present

## 2016-02-02 DIAGNOSIS — E785 Hyperlipidemia, unspecified: Secondary | ICD-10-CM | POA: Insufficient documentation

## 2016-02-02 HISTORY — DX: Nausea with vomiting, unspecified: R11.2

## 2016-02-02 HISTORY — PX: ESOPHAGOGASTRODUODENOSCOPY (EGD) WITH PROPOFOL: SHX5813

## 2016-02-02 HISTORY — DX: Other specified postprocedural states: Z98.890

## 2016-02-02 SURGERY — ESOPHAGOGASTRODUODENOSCOPY (EGD) WITH PROPOFOL
Anesthesia: Monitor Anesthesia Care

## 2016-02-02 MED ORDER — SODIUM CHLORIDE 0.9 % IV SOLN
INTRAVENOUS | Status: DC
Start: 2016-02-02 — End: 2016-02-02

## 2016-02-02 MED ORDER — PROPOFOL 500 MG/50ML IV EMUL
INTRAVENOUS | Status: DC | PRN
  Administered 2016-02-02 (×2): 20 mg via INTRAVENOUS
  Administered 2016-02-02: 50 mg via INTRAVENOUS
  Administered 2016-02-02: 20 mg via INTRAVENOUS

## 2016-02-02 MED ORDER — LACTATED RINGERS IV SOLN
INTRAVENOUS | Status: DC | PRN
  Administered 2016-02-02: 11:00:00 via INTRAVENOUS

## 2016-02-02 MED ORDER — KETAMINE HCL 10 MG/ML IJ SOLN
INTRAMUSCULAR | Status: AC
  Filled 2016-02-02: qty 1

## 2016-02-02 MED ORDER — PROPOFOL 10 MG/ML IV BOLUS
INTRAVENOUS | Status: AC
  Filled 2016-02-02: qty 40

## 2016-02-02 MED ORDER — LACTATED RINGERS IV SOLN
INTRAVENOUS | Status: DC
  Administered 2016-02-02: 10:00:00 via INTRAVENOUS

## 2016-02-02 SURGICAL SUPPLY — 15 items

## 2016-02-02 NOTE — Transfer of Care (Signed)
Immediate Anesthesia Transfer of Care Note  Patient: Nicole [name]  Procedure(s) Performed: Procedure(s): ESOPHAGOGASTRODUODENOSCOPY (EGD) WITH PROPOFOL (N/A)  Patient Location: PACU and Endoscopy Unit  Anesthesia Type:MAC  Level of Consciousness: sedated  Airway & Oxygen Therapy: Patient Spontanous Breathing and Patient connected to nasal cannula oxygen  Post-op Assessment: Report given to RN and Post -op Vital signs reviewed and stable  Post vital signs: Reviewed and stable  Last Vitals:  Filed Vitals:   02/02/16 1009  BP: 115/56  Pulse: 65  Temp: 36.9 C  Resp: 14    Complications: No apparent anesthesia complications

## 2016-02-02 NOTE — Op Note (Signed)
[facility] Patient Name: Nicole [name] Procedure Date: 02/02/2016 MRN: [id number] Attending MD: Gatha Mayer , MD Date of Birth: 05-20-1956 CSN:  Age: 60 Admit Type: Outpatient Procedure:                Upper GI endoscopy Indications:              Epigastric abdominal pain, Dysphagia Providers:                Gatha Mayer, MD, Carolynn Comment, RN, Despina Pole, Technician, Glenis Smoker, CRNA Referring MD:              Medicines:                Monitored Anesthesia Care Complications:            No immediate complications. Estimated Blood Loss:     Estimated blood loss: none. Procedure:                Pre-Anesthesia Assessment:                           - Prior to the procedure, a History and Physical                            was performed, and patient medications and                            allergies were reviewed. The patient's tolerance of                            previous anesthesia was also reviewed. The risks                            and benefits of the procedure and the sedation                            options and risks were discussed with the patient.                            All questions were answered, and informed consent                            was obtained. Prior Anticoagulants: The patient has                            taken no previous anticoagulant or antiplatelet                            agents. ASA Grade Assessment: II - A patient with                            mild systemic disease. After reviewing the risks  and benefits, the patient was deemed in                            satisfactory condition to undergo the procedure.                           After obtaining informed consent, the endoscope was                            passed under direct vision. Throughout the                            procedure, the patient's blood pressure, pulse, and       oxygen saturations were monitored continuously. The                            [id number]) scope was introduced through the                            mouth, and advanced to the afferent and efferent                            jejunal loops. The upper GI endoscopy was                            accomplished without difficulty. The patient                            tolerated the procedure well. Scope In: Scope Out: Findings:      The examined esophagus was normal.      Evidence of a gastric bypass was found. A gastric pouch with a 10 cm       length from the GE junction to the gastrojejunal anastomosis was found.       The gastrojejunal anastomosis was characterized by healthy appearing       mucosa. This was traversed. The pouch-to-jejunum limb was characterized       by healthy appearing mucosa. The jejunojejunal anastomosis was       characterized by healthy appearing mucosa. The duodenum-to-jejunum limb       was not examined as it could not be reached.      The cardia and gastric fundus were normal on retroflexion. Impression:               - Normal esophagus.                           - Gastric bypass with a pouch 10 cm in length.                            Gastrojejunal anastomosis characterized by healthy                            appearing mucosa.                           - No specimens collected.  Moderate Sedation:      Please see anesthesia notes, moderate sedation not given Recommendation:           - Patient has a contact number available for                            emergencies. The signs and symptoms of potential                            delayed complications were discussed with the                            patient. Return to normal activities tomorrow.                            Written discharge instructions were provided to the                            patient.                           - Resume previous diet.                           - Continue  present medications.                           - No repeat upper endoscopy.                           - Perform ambulatory esophageal manometry at                            appointment to be scheduled.                           - If she desires - will discuss Procedure Code(s):        --- Professional ---                           [phone], Esophagogastroduodenoscopy, flexible,                            transoral; diagnostic, including collection of                            specimen(s) by brushing or washing, when performed                            (separate procedure) Diagnosis Code(s):        --- Professional ---                           [id number], Bariatric surgery status                           R10.13, Epigastric pain  R13.10, Dysphagia, unspecified CPT copyright 2016 American Medical Association. All rights reserved. The codes documented in this report are preliminary and upon coder review may  be revised to meet current compliance requirements. Gatha Mayer, MD 02/02/2016 11:57:50 AM This report has been signed electronically. Number of Addenda: 0

## 2016-02-02 NOTE — Anesthesia Preprocedure Evaluation (Addendum)
Anesthesia Evaluation  Patient identified by MRN, date of birth, ID band Patient awake    Reviewed: Allergy & Precautions, NPO status , Patient's Chart, lab work & pertinent test results  History of Anesthesia Complications (+) PONV and history of anesthetic complications  Airway Mallampati: II  TM Distance: >3 FB Neck ROM: Full    Dental no notable dental hx.    Pulmonary neg pulmonary ROS,    Pulmonary exam normal breath sounds clear to auscultation       Cardiovascular Exercise Tolerance: Good hypertension, Pt. on medications + Past MI  Normal cardiovascular exam Rhythm:Regular Rate:Normal     Neuro/Psych PSYCHIATRIC DISORDERS Depression  Neuromuscular disease    GI/Hepatic Neg liver ROS, GERD  Medicated,  Endo/Other  negative endocrine ROS  Renal/GU negative Renal ROS  negative genitourinary   Musculoskeletal  (+) Arthritis ,   Abdominal (+) + obese,   Peds negative pediatric ROS (+)  Hematology  (+) anemia ,   Anesthesia Other Findings   Reproductive/Obstetrics negative OB ROS                             Anesthesia Physical Anesthesia Plan  ASA: II  Anesthesia Plan: MAC   Post-op Pain Management:    Induction: Intravenous  Airway Management Planned: Natural Airway  Additional Equipment:   Intra-op Plan:   Post-operative Plan:   Informed Consent: I have reviewed the patients History and Physical, chart, labs and discussed the procedure including the risks, benefits and alternatives for the proposed anesthesia with the patient or authorized representative who has indicated his/her understanding and acceptance.   Dental advisory given  Plan Discussed with: CRNA  Anesthesia Plan Comments:         Anesthesia Quick Evaluation

## 2016-02-02 NOTE — Anesthesia Postprocedure Evaluation (Signed)
Anesthesia Post Note  Patient: Nicole [name]  Procedure(s) Performed: Procedure(s) (LRB): ESOPHAGOGASTRODUODENOSCOPY (EGD) WITH PROPOFOL (N/A)  Patient location during evaluation: PACU Anesthesia Type: MAC Level of consciousness: awake and alert Pain management: pain level controlled Vital Signs Assessment: post-procedure vital signs reviewed and stable Respiratory status: spontaneous breathing, nonlabored ventilation, respiratory function stable and patient connected to nasal cannula oxygen Cardiovascular status: stable and blood pressure returned to baseline Anesthetic complications: no    Last Vitals:  Filed Vitals:   02/02/16 1200 02/02/16 1210  BP: 103/38 115/68  Pulse: 62 63  Temp:    Resp: 9 11    Last Pain: There were no vitals filed for this visit.               Quintus Premo J

## 2016-02-02 NOTE — H&P (Signed)
[facility] History and Physical   Primary Care Physician:  Tivis Ringer, MD   Reason for Procedure:   evaluate and treat - dysphagia and epigastric pain  Plan:    EGD, possible esophageal dilation - The risks and benefits as well as alternatives of endoscopic procedure(s) have been discussed and reviewed. All questions answered. The patient agrees to proceed.      HPI: Nicole [name] is a 60 y.o. female with recurrent epigastric pain and solid dysphagia. Previous US and CT abd unrevelaing.   Past Medical History  Diagnosis Date  . Allergy   . Hypertension   . Hyperlipidemia   . Anemia     iron deficiency  . Back pain 07/25/2015    lower back pain  . Obesity   . Cardiomegaly     on CXR  . Breast mass, left     benign fibroadenoma  . Cellulitis and abscess of leg 11/2010    resolved - right leg  . Iron deficiency anemia   . GERD (gastroesophageal reflux disease)   . Depression   . Arthritis     hands, right knee, shoulders  . Primary localized osteoarthritis of left knee   . DJD (degenerative joint disease)   . Cataract     surgery scheduled for right eye on 04/03/16  . Carpal tunnel syndrome     bilateral feet pain and numbness  . Myocardial infarction ([place]) 10/2014    non-ST elevated, no cause found  . PONV (postoperative nausea and vomiting)     with just 1 surgery    Past Surgical History  Procedure Laterality Date  . Spine surgery  lower l 5 to s 1 discectomy, then fusion 18 months later    x 2  . Gastric bypass    . Total knee arthroplasty Left 07/25/2015    Procedure: TOTAL KNEE ARTHROPLASTY;  Surgeon: Elsie Saas, MD;  Location: [place] [facility];  Service: Orthopedics;  Laterality: Left;  . Rotator cuff repair Right 2003  . Cardiac catheterization N/A 11/14/2015    Procedure: Left Heart Cath and Coronary Angiography;  Surgeon: Lorretta Harp, MD;  Location: [facility];  Service: Cardiovascular;  Laterality: N/A;  . Laparoscopic  nissen fundoplication    . Lasik    . Breast lumpectomy Left     benign  . Knee surgery Right     x 3  . Abdominal hysterectomy      Prior to Admission medications   Medication Sig Start Date End Date Taking? Authorizing Provider  BENICAR 40 MG tablet Take 20 mg by mouth daily. 06/18/15  Yes Historical Provider, MD  buPROPion (WELLBUTRIN XL) 150 MG 24 hr tablet Take 150 mg by mouth daily. 10/27/15  Yes Historical Provider, MD  cetirizine (ZYRTEC) 10 MG tablet Take 10 mg by mouth daily.   Yes Historical Provider, MD  citalopram (CELEXA) 40 MG tablet Take 40 mg by mouth daily. 06/23/15  Yes Historical Provider, MD  fluticasone (FLONASE) 50 MCG/ACT nasal spray Place 2 sprays into both nostrils daily as needed for allergies or rhinitis.  10/27/15  Yes Historical Provider, MD  gabapentin (NEURONTIN) 300 MG capsule Take 300 mg by mouth 2 (two) times daily.  04/17/15  Yes Historical Provider, MD  meloxicam (MOBIC) 15 MG tablet Take 15 mg by mouth daily. 10/27/15  Yes Historical Provider, MD  omeprazole (PRILOSEC) 20 MG capsule Take 20 mg by mouth daily.   Yes Historical Provider, MD  aspirin EC 325 MG  EC tablet 1 tab a day for the next 30 days to prevent blood clots Patient not taking: Reported on 01/20/2016 07/27/15   Kirstin Shepperson, PA-C  atorvastatin (LIPITOR) 40 MG tablet Take 1 tablet (40 mg total) by mouth daily at 6 PM. Patient not taking: Reported on 01/20/2016 11/15/15   Barton Dubois, MD  pantoprazole (PROTONIX) 40 MG tablet Take 1 tablet (40 mg total) by mouth 2 (two) times daily. Patient not taking: Reported on 01/20/2016 11/15/15   Barton Dubois, MD    Current Facility-Administered Medications  Medication Dose Route Frequency Provider Last Rate Last Dose  . 0.9 %  sodium chloride infusion   Intravenous Continuous Gatha Mayer, MD      . lactated ringers infusion   Intravenous Continuous Gatha Mayer, MD 125 mL/hr at 02/02/16 1014      Allergies as of 01/11/2016 - Review Complete  12/29/2015  Allergen Reaction Noted  . Codeine Hives 12/26/2013  . Contrast media [iodinated diagnostic agents] Hives 07/14/2015    Family History  Problem Relation Age of Onset  . CAD Father     CABG  . Heart attack Father 3  . Gout Father   . Leukemia Father     questionable  . Hyperlipidemia Mother   . Sleep apnea Mother   . Hypertension Mother   . ADD / ADHD Daughter     x 2  . Colon cancer Neg Hx   . Esophageal cancer Neg Hx   . Rectal cancer Neg Hx   . Stomach cancer Neg Hx     Social History   Social History  . Marital Status: Divorced    Spouse Name: N/A  . Number of Children: 2  . Years of Education: N/A   Occupational History  .  teacher at [employer] History Main Topics  . Smoking status: Never Smoker   . Smokeless tobacco: Never Used  . Alcohol Use: No  . Drug Use: No  . Sexual Activity: Yes    Birth Control/ Protection: Post-menopausal     Comment: Hysterectomy   Other Topics Concern  . Not on file   Social History Narrative   Divorced teacher 2 daughters. One caffeinated beverage daily.    Review of Systems: All other review of systems negative except as mentioned in the HPI.  Physical Exam: Vital signs in last 24 hours: Temp:  [98.4 F (36.9 C)] 98.4 F (36.9 C) (04/20 1009) Pulse Rate:  [65] 65 (04/20 1009) Resp:  [14] 14 (04/20 1009) BP: (115)/(56) 115/56 mmHg (04/20 1009) SpO2:  [97 %] 97 % (04/20 1009) Weight:  [248 lb (112.492 kg)] 248 lb (112.492 kg) (04/20 1009)   General:   Alert,  Well-developed, well-nourished, pleasant and cooperative in NAD Lungs:  Clear throughout to auscultation.   Heart:  Regular rate and rhythm; no murmurs, clicks, rubs,  or gallops. Abdomen:  Soft, nontender and nondistended. Normal bowel sounds.   Neuro/Psych:  Alert and cooperative. Normal mood and affect. A and O x 3   @Katilin Raynes  Simonne Maffucci, MD, [facility] [phone] (pager) 02/02/2016 11:02 AM@

## 2016-02-02 NOTE — Discharge Instructions (Addendum)
The exam looks normal.  Will discuss next steps - could evaluate the swallowing problems with an esophageal manometry test.  I appreciate the opportunity to care for you. Gatha Mayer, MD, FACG  YOU HAD AN ENDOSCOPIC PROCEDURE TODAY: Refer to the procedure report and other information in the discharge instructions given to you for any specific questions about what was found during the examination. If this information does not answer your questions, please call Dr. Celesta Aver office at [phone] to clarify.   YOU SHOULD EXPECT: Some feelings of bloating in the abdomen. Passage of more gas than usual. Walking can help get rid of the air that was put into your GI tract during the procedure and reduce the bloating. If you had a lower endoscopy (such as a colonoscopy or flexible sigmoidoscopy) you may notice spotting of blood in your stool or on the toilet paper. Some abdominal soreness may be present for a day or two, also.  DIET: Your first meal following the procedure should be a light meal and then it is ok to progress to your normal diet. A half-sandwich or bowl of soup is an example of a good first meal. Heavy or fried foods are harder to digest and may make you feel nauseous or bloated. Drink plenty of fluids but you should avoid alcoholic beverages for 24 hours.   ACTIVITY: Your care partner should take you home directly after the procedure. You should plan to take it easy, moving slowly for the rest of the day. You can resume normal activity the day after the procedure however YOU SHOULD NOT DRIVE, use power tools, machinery or perform tasks that involve climbing or major physical exertion for 24 hours (because of the sedation medicines used during the test).   SYMPTOMS TO REPORT IMMEDIATELY: A gastroenterologist can be reached at any hour. Please call [phone]  for any of the following symptoms:  Following lower endoscopy (colonoscopy, flexible sigmoidoscopy) Excessive  amounts of blood in the stool  Significant tenderness, worsening of abdominal pains  Swelling of the abdomen that is new, acute  Fever of 100 or higher  Following upper endoscopy (EGD, EUS, ERCP, esophageal dilation) Vomiting of blood or coffee ground material  New, significant abdominal pain  New, significant chest pain or pain under the shoulder blades  Painful or persistently difficult swallowing  New shortness of breath  Black, tarry-looking or red, bloody stools  FOLLOW UP:  If any biopsies were taken you will be contacted by phone or by letter within the next 1-3 weeks. Call [phone]  if you have not heard about the biopsies in 3 weeks.  Please also call with any specific questions about appointments or follow up tests.  Moderate Conscious Sedation, Adult, Care After Refer to this sheet in the next few weeks. These instructions provide you with information on caring for yourself after your procedure. Your health care provider may also give you more specific instructions. Your treatment has been planned according to current medical practices, but problems sometimes occur. Call your health care provider if you have any problems or questions after your procedure. WHAT TO EXPECT AFTER THE PROCEDURE  After your procedure:  You may feel sleepy, clumsy, and have poor balance for several hours.  Vomiting may occur if you eat too soon after the procedure. HOME CARE INSTRUCTIONS  Do not participate in any activities where you could become injured for at least 24 hours. Do not:  Drive.  Swim.  Ride a bicycle.  Operate  heavy machinery.  Cook.  Use power tools.  Climb ladders.  Work from a high place.  Do not make important decisions or sign legal documents until you are improved.  If you vomit, drink water, juice, or soup when you can drink without vomiting. Make sure you have little or no nausea before eating solid foods.  Only take over-the-counter or prescription  medicines for pain, discomfort, or fever as directed by your health care provider.  Make sure you and your family fully understand everything about the medicines given to you, including what side effects may occur.  You should not drink alcohol, take sleeping pills, or take medicines that cause drowsiness for at least 24 hours.  If you smoke, do not smoke without supervision.  If you are feeling better, you may resume normal activities 24 hours after you were sedated.  Keep all appointments with your health care provider. SEEK MEDICAL CARE IF:  Your skin is pale or bluish in color.  You continue to feel nauseous or vomit.  Your pain is getting worse and is not helped by medicine.  You have bleeding or swelling.  You are still sleepy or feeling clumsy after 24 hours. SEEK IMMEDIATE MEDICAL CARE IF:  You develop a rash.  You have difficulty breathing.  You develop any type of allergic problem.  You have a fever. MAKE SURE YOU:  Understand these instructions.  Will watch your condition.  Will get help right away if you are not doing well or get worse.   This information is not intended to replace advice given to you by your health care provider. Make sure you discuss any questions you have with your health care provider.   Document Released: 07/22/2013 Document Revised: 10/22/2014 Document Reviewed: 07/22/2013 Elsevier Interactive Patient Education [employer].

## 2016-02-07 ENCOUNTER — Encounter (HOSPITAL_COMMUNITY): Payer: Self-pay | Admitting: Internal Medicine

## 2016-07-18 ENCOUNTER — Encounter (HOSPITAL_COMMUNITY): Payer: Self-pay

## 2016-07-31 ENCOUNTER — Encounter (HOSPITAL_COMMUNITY): Payer: Self-pay | Admitting: Physician Assistant

## 2016-07-31 DIAGNOSIS — Z969 Presence of functional implant, unspecified: Secondary | ICD-10-CM

## 2016-07-31 DIAGNOSIS — Z96652 Presence of left artificial knee joint: Secondary | ICD-10-CM

## 2016-07-31 DIAGNOSIS — M1711 Unilateral primary osteoarthritis, right knee: Secondary | ICD-10-CM | POA: Diagnosis present

## 2016-07-31 HISTORY — DX: Presence of left artificial knee joint: Z96.652

## 2016-07-31 HISTORY — DX: Presence of functional implant, unspecified: Z96.9

## 2016-07-31 NOTE — H&P (Signed)
TOTAL KNEE ADMISSION H&P  Patient is being admitted for right total knee arthroplasty.  Subjective:  Chief Complaint:right knee pain.  HPI: Nicole [name], 60 y.o. female, has a history of pain and functional disability in the right knee due to arthritis and has failed non-surgical conservative treatments for greater than 12 weeks to includeNSAID's and/or analgesics, corticosteriod injections, viscosupplementation injections, flexibility and strengthening excercises, supervised PT with diminished ADL's post treatment, weight reduction as appropriate and activity modification.  Onset of symptoms was gradual, starting 10 years ago with gradually worsening course since that time. The patient noted prior procedures on the knee to include  arthroscopy and menisectomy on the right knee(s).  Patient currently rates pain in the right knee(s) at 10 out of 10 with activity. Patient has night pain, worsening of pain with activity and weight bearing, pain that interferes with activities of daily living, crepitus and joint swelling.  Patient has evidence of subchondral sclerosis, periarticular osteophytes and joint space narrowing by imaging studies. .Patient had a Fulkerson as a teenager.  Retained Broken Screw There is no active infection.  Patient Active Problem List   Diagnosis Date Noted  . S/P total knee arthroplasty, left 07/31/2016  . Presence of retained hardware Broken screw from old Fulkerson patella reallignment 07/31/2016  . Primary localized osteoarthritis of right knee   . Esophageal reflux   . NSTEMI (non-ST elevated myocardial infarction) ([place]) 11/14/2015  . Obesity (BMI 35.0-39.9 without comorbidity) 11/14/2015  . Chest pain 11/13/2015  . Benign essential HTN 11/13/2015  . Dyslipidemia 11/13/2015  . Back pain 07/25/2015  . DJD (degenerative joint disease) of knee 07/25/2015  . Primary localized osteoarthritis of left knee   . Abnormality of gait 09/16/2014  . Hallux rigidus of left  foot 09/16/2014  . 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 Allergy   . Anemia    iron deficiency  . Arthritis    hands, right knee, shoulders  . Back pain 07/25/2015   lower back pain  . Breast mass, left    benign fibroadenoma  . Cardiomegaly    on CXR  . Carpal tunnel syndrome    bilateral feet pain and numbness  . Cataract    surgery scheduled for right eye on 04/03/16  . Cellulitis and abscess of leg 11/2010   resolved - right leg  . Depression   . DJD (degenerative joint disease)   . GERD (gastroesophageal reflux disease)   . Hyperlipidemia   . Hypertension   . Iron deficiency anemia   . Myocardial infarction 10/2014   non-ST elevated, no cause found  . Obesity   . PONV (postoperative nausea and vomiting)    with just 1 surgery  . Presence of retained hardware Broken screw from old Fulkerson patella reallignment 07/31/2016  . Primary localized osteoarthritis of left knee   . Primary localized osteoarthritis of right knee   . S/P total knee arthroplasty, left 07/31/2016    Past Surgical History:  Procedure Laterality Date  . ABDOMINAL HYSTERECTOMY    . BREAST LUMPECTOMY Left    benign  . CARDIAC CATHETERIZATION N/A 11/14/2015   Procedure: Left Heart Cath and Coronary Angiography;  Surgeon: Lorretta Harp, MD;  Location: [facility];  Service: Cardiovascular;  Laterality: N/A;  . ESOPHAGOGASTRODUODENOSCOPY (EGD) WITH PROPOFOL N/A 02/02/2016   Procedure: ESOPHAGOGASTRODUODENOSCOPY (EGD) WITH PROPOFOL;  Surgeon: Gatha Mayer, MD;  Location: [facility];  Service: Endoscopy;  Laterality:  N/A;  . GASTRIC BYPASS    . KNEE SURGERY Right    x 3  . LAPAROSCOPIC NISSEN FUNDOPLICATION    . LASIK    . ROTATOR CUFF REPAIR Right 2003  . SPINE SURGERY  lower l 5 to s 1 discectomy, then fusion 18 months later   x 2  . TOTAL KNEE ARTHROPLASTY Left 07/25/2015   Procedure: TOTAL KNEE ARTHROPLASTY;  Surgeon: Elsie Saas, MD;   Location: [facility];  Service: Orthopedics;  Laterality: Left;    No current facility-administered medications for this encounter.   Current Outpatient Prescriptions:  .  benazepril (LOTENSIN) 10 MG tablet, Take 10 mg by mouth daily., Disp: , Rfl:  .  buPROPion (WELLBUTRIN XL) 150 MG 24 hr tablet, Take 150 mg by mouth daily., Disp: , Rfl: 6 .  cetirizine (ZYRTEC) 10 MG tablet, Take 10 mg by mouth daily., Disp: , Rfl:  .  citalopram (CELEXA) 40 MG tablet, Take 40 mg by mouth daily., Disp: , Rfl: 6 .  fluticasone (FLONASE) 50 MCG/ACT nasal spray, Place 2 sprays into both nostrils daily as needed for allergies or rhinitis. , Disp: , Rfl: 11 .  gabapentin (NEURONTIN) 300 MG capsule, Take 300 mg by mouth 2 (two) times daily. , Disp: , Rfl: 3 .  meloxicam (MOBIC) 15 MG tablet, Take 15 mg by mouth daily., Disp: , Rfl: 6 .  omeprazole (PRILOSEC) 20 MG capsule, Take 20 mg by mouth daily., Disp: , Rfl:     Allergies  Allergen Reactions  . Codeine Hives  . Contrast Media [Iodinated Diagnostic Agents] Hives  . Iron     Cannot process iron supplements, hot flashes and pain    Social History  Substance Use Topics  . Smoking status: Never Smoker  . Smokeless tobacco: Never Used  . Alcohol use No    Family History  Problem Relation Age of Onset  . CAD Father     CABG  . Heart attack Father 18  . Gout Father   . Leukemia Father     questionable  . Hyperlipidemia Mother   . Sleep apnea Mother   . Hypertension Mother   . ADD / ADHD Daughter     x 2  . Colon cancer Neg Hx   . Esophageal cancer Neg Hx   . Rectal cancer Neg Hx   . Stomach cancer Neg Hx      Review of Systems  Constitutional: Negative.   HENT: Negative.   Eyes: Negative.   Respiratory: Negative.   Cardiovascular: Negative.   Gastrointestinal: Negative.   Genitourinary: Negative.   Musculoskeletal: Positive for back pain and joint pain.  Skin: Negative.   Neurological: Negative.   Endo/Heme/Allergies: Negative.    Psychiatric/Behavioral: Negative.     Objective:  Physical Exam  Constitutional: She is oriented to person, place, and time. She appears well-developed and well-nourished.  HENT:  Head: Normocephalic.  Mouth/Throat: Oropharynx is clear and moist.  Eyes: Conjunctivae and EOM are normal. Pupils are equal, round, and reactive to light.  Neck: Neck supple.  Cardiovascular: Normal rate and regular rhythm.   Respiratory: Effort normal and breath sounds normal.  GI: Soft. Bowel sounds are normal.  Genitourinary:  Genitourinary Comments: Not pertinent to current symptomatology therefore not examined.  Musculoskeletal:  Right knee varus 2+ crep, 1+ synovitis, 0-120. 2+ DP pulse.  Left total knee has full range of motion without pain swelling or deformity.    Neurological: She is alert and oriented to person, place,  and time.  Skin: Skin is warm and dry.  Psychiatric: She has a normal mood and affect. Her behavior is normal.    Vital signs in last 24 hours: Temp:  [97.5 F (36.4 C)] 97.5 F (36.4 C) (10/17 1700) BP: (126)/(85) 126/85 (10/17 1700) SpO2:  [98 %] 98 % (10/17 1700) Weight:  [116.1 kg (256 lb)] 116.1 kg (256 lb) (10/17 1700)  Labs:   Estimated body mass index is 40.1 kg/m as calculated from the following:   Height as of this encounter: 5\' 7"  (1.702 m).   Weight as of this encounter: 116.1 kg (256 lb).   Imaging Review Plain radiographs demonstrate severe degenerative joint disease of the right knee(s). The overall alignment issignificant varus. The bone quality appears to be good for age and reported activity level. RETAINED HARDWARE IN TIBIA FROM OLD FULKERSON PROCEDURE.  SCREW IS BROKEN  Assessment/Plan:  End stage arthritis, right knee  Principal Problem:   Primary localized osteoarthritis of right knee Active Problems:   Abnormality of gait   Back pain   Benign essential HTN   Obesity (BMI 35.0-39.9 without comorbidity)   S/P total knee arthroplasty,  left   Presence of retained hardware Broken screw from old Fulkerson patella reallignment    The patient history, physical examination, clinical judgment of the provider and imaging studies are consistent with end stage degenerative joint disease of the right knee(s) and total knee arthroplasty is deemed medically necessary. The treatment options including medical management, injection therapy arthroscopy and arthroplasty were discussed at length. The risks and benefits of total knee arthroplasty were presented and reviewed. The risks due to aseptic loosening, infection, stiffness, patella tracking problems, thromboembolic complications and other imponderables were discussed. The patient acknowledged the explanation, agreed to proceed with the plan and consent was signed. Patient is being admitted for inpatient treatment for surgery, pain control, PT, OT, prophylactic antibiotics, VTE prophylaxis, progressive ambulation and ADL's and discharge planning. The patient is planning to be discharged home with home health services

## 2016-08-03 ENCOUNTER — Encounter (HOSPITAL_COMMUNITY): Payer: Self-pay

## 2016-08-03 ENCOUNTER — Encounter (HOSPITAL_COMMUNITY)
Admission: RE | Admit: 2016-08-03 | Discharge: 2016-08-03 | Disposition: A | Discharge status: Home / Self Care | Payer: BC Managed Care – PPO | Source: Ambulatory Visit | Attending: Orthopedic Surgery | Admitting: Orthopedic Surgery

## 2016-08-03 ENCOUNTER — Other Ambulatory Visit (HOSPITAL_COMMUNITY): Payer: Self-pay

## 2016-08-03 DIAGNOSIS — M1711 Unilateral primary osteoarthritis, right knee: Secondary | ICD-10-CM | POA: Diagnosis not present

## 2016-08-03 DIAGNOSIS — I10 Essential (primary) hypertension: Secondary | ICD-10-CM | POA: Diagnosis not present

## 2016-08-03 DIAGNOSIS — Z6839 Body mass index (BMI) 39.0-39.9, adult: Secondary | ICD-10-CM | POA: Diagnosis not present

## 2016-08-03 DIAGNOSIS — E785 Hyperlipidemia, unspecified: Secondary | ICD-10-CM | POA: Insufficient documentation

## 2016-08-03 DIAGNOSIS — M1712 Unilateral primary osteoarthritis, left knee: Secondary | ICD-10-CM | POA: Diagnosis not present

## 2016-08-03 DIAGNOSIS — Z01812 Encounter for preprocedural laboratory examination: Secondary | ICD-10-CM | POA: Insufficient documentation

## 2016-08-03 DIAGNOSIS — M549 Dorsalgia, unspecified: Secondary | ICD-10-CM | POA: Insufficient documentation

## 2016-08-03 DIAGNOSIS — Z96652 Presence of left artificial knee joint: Secondary | ICD-10-CM | POA: Diagnosis not present

## 2016-08-03 DIAGNOSIS — E669 Obesity, unspecified: Secondary | ICD-10-CM | POA: Insufficient documentation

## 2016-08-03 HISTORY — DX: Other complications of anesthesia, initial encounter: T88.59XA

## 2016-08-03 HISTORY — DX: Adverse effect of unspecified anesthetic, initial encounter: T41.45XA

## 2016-08-03 HISTORY — DX: Anxiety disorder, unspecified: F41.9

## 2016-08-03 LAB — COMPREHENSIVE METABOLIC PANEL
ALBUMIN: 4 g/dL (ref 3.5–5.0)
ALK PHOS: 80 U/L (ref 38–126)
ALT: 22 U/L (ref 14–54)
ANION GAP: 6 (ref 5–15)
AST: 15 U/L (ref 15–41)
BILIRUBIN TOTAL: 0.8 mg/dL (ref 0.3–1.2)
BUN: 12 mg/dL (ref 6–20)
CALCIUM: 9.3 mg/dL (ref 8.9–10.3)
CO2: 25 mmol/L (ref 22–32)
CREATININE: 0.77 mg/dL (ref 0.44–1.00)
Chloride: 108 mmol/L (ref 101–111)
GFR calc Af Amer: >60 mL/min (ref >60)
GFR calc non Af Amer: >60 mL/min (ref >60)
GLUCOSE: 79 mg/dL (ref 65–99)
Potassium: 4.2 mmol/L (ref 3.5–5.1)
SODIUM: 139 mmol/L (ref 135–145)
TOTAL PROTEIN: 7.4 g/dL (ref 6.5–8.1)

## 2016-08-03 LAB — CBC WITH DIFFERENTIAL/PLATELET
BASOS PCT: 1 %
Basophils Absolute: 0.1 10*3/uL (ref 0.0–0.1)
EOS ABS: 0.2 10*3/uL (ref 0.0–0.7)
Eosinophils Relative: 2 %
HEMATOCRIT: 36.1 % (ref 36.0–46.0)
HEMOGLOBIN: 11.5 g/dL — AB (ref 12.0–15.0)
Lymphocytes Relative: 26 %
Lymphs Abs: 2.1 10*3/uL (ref 0.7–4.0)
MCH: 26.4 pg (ref 26.0–34.0)
MCHC: 31.9 g/dL (ref 30.0–36.0)
MCV: 82.8 fL (ref 78.0–100.0)
MONOS PCT: 10 %
Monocytes Absolute: 0.8 10*3/uL (ref 0.1–1.0)
NEUTROS ABS: 4.8 10*3/uL (ref 1.7–7.7)
NEUTROS PCT: 61 %
Platelets: 275 10*3/uL (ref 150–400)
RBC: 4.36 MIL/uL (ref 3.87–5.11)
RDW: 14.3 % (ref 11.5–15.5)
WBC: 7.9 10*3/uL (ref 4.0–10.5)

## 2016-08-03 LAB — PROTIME-INR
INR: 1.05
Prothrombin Time: 13.7 seconds (ref 11.4–15.2)

## 2016-08-03 LAB — TYPE AND SCREEN
ABO/RH(D): A POS
Antibody Screen: NEGATIVE

## 2016-08-03 LAB — SURGICAL PCR SCREEN
MRSA, PCR: NEGATIVE
STAPHYLOCOCCUS AUREUS: NEGATIVE

## 2016-08-03 LAB — APTT: aPTT: 34 seconds (ref 24–36)

## 2016-08-03 NOTE — Pre-Procedure Instructions (Signed)
Nicole [name]  08/03/2016      [id number] - [address], [place] [address] [place] [address] Phone: [phone] Fax: [phone]    Your procedure is scheduled on October 30  Report to [facility] at 0730 A.M.  Call this number if you have problems the morning of surgery:  [phone]   Remember:  Do not eat food or drink liquids after midnight.   Take these medicines the morning of surgery with A SIP OF WATER buPROPion (WELLBUTRIN XL), cetirizine (ZYRTEC), citalopram (CELEXA),  fluticasone (FLONASE),gabapentin (NEURONTIN) ,  omeprazole (PRILOSEC)  7 days prior to surgery STOP taking any MOBIC, Aspirin, Aleve, Naproxen, Ibuprofen, Motrin, Advil, Goody's, BC's, all herbal medications, fish oil, and all vitamins    Do not wear jewelry, make-up or nail polish.  Do not wear lotions, powders, or perfumes, or deoderant.  Do not shave 48 hours prior to surgery.    Do not bring valuables to the hospital.  [facility] is not responsible for any belongings or valuables.  Contacts, dentures or bridgework may not be worn into surgery.  Leave your suitcase in the car.  After surgery it may be brought to your room.  For patients admitted to the hospital, discharge time will be determined by your treatment team.  Patients discharged the day of surgery will not be allowed to drive home.    Special instructions:   [facility]- Preparing For Surgery  Before surgery, you can play an important role. Because skin is not sterile, your skin needs to be as free of germs as possible. You can reduce the number of germs on your skin by washing with CHG (chlorahexidine gluconate) Soap before surgery.  CHG is an antiseptic cleaner which kills germs and bonds with the skin to continue killing germs even after washing.  Please do not use if you have an allergy to CHG or antibacterial soaps. If your skin becomes reddened/irritated stop  using the CHG.  Do not shave (including legs and underarms) for at least 48 hours prior to first CHG shower. It is OK to shave your face.  Please follow these instructions carefully.   1. Shower the NIGHT BEFORE SURGERY and the MORNING OF SURGERY with CHG.   2. If you chose to wash your hair, wash your hair first as usual with your normal shampoo.  3. After you shampoo, rinse your hair and body thoroughly to remove the shampoo.  4. Use CHG as you would any other liquid soap. You can apply CHG directly to the skin and wash gently with a scrungie or a clean washcloth.   5. Apply the CHG Soap to your body ONLY FROM THE NECK DOWN.  Do not use on open wounds or open sores. Avoid contact with your eyes, ears, mouth and genitals (private parts). Wash genitals (private parts) with your normal soap.  6. Wash thoroughly, paying special attention to the area where your surgery will be performed.  7. Thoroughly rinse your body with warm water from the neck down.  8. DO NOT shower/wash with your normal soap after using and rinsing off the CHG Soap.  9. Pat yourself dry with a CLEAN TOWEL.   10. Wear CLEAN PAJAMAS   11. Place CLEAN SHEETS on your bed the night of your first shower and DO NOT SLEEP WITH PETS.    Day of Surgery: Do not apply any deodorants/lotions. Please wear clean clothes to the hospital/surgery center.  Please read over the following fact sheets that you were given.

## 2016-08-03 NOTE — Progress Notes (Signed)
PCP: Ravisankar Ava No cardiologist currently  Pt states she had a cath in January d/t chest pain. States MD told her she did not have a heart attack.  CATH: 11/14/15 EKG: 11/13/15  Pt with no complaints of chest pain, SOB at PAT appointment. BP 97/56 pt states BP runs lower in the AM, is usually in 120s by afternoon.

## 2016-08-04 LAB — URINE CULTURE

## 2016-08-13 ENCOUNTER — Inpatient Hospital Stay (HOSPITAL_COMMUNITY)
Admission: RE | Admit: 2016-08-13 | Discharge: 2016-08-15 | DRG: 470 | Disposition: A | Discharge status: Home Health | Payer: BC Managed Care – PPO | Source: Ambulatory Visit | Attending: Orthopedic Surgery | Admitting: Orthopedic Surgery

## 2016-08-13 ENCOUNTER — Inpatient Hospital Stay (HOSPITAL_COMMUNITY): Payer: BC Managed Care – PPO | Admitting: Anesthesiology

## 2016-08-13 ENCOUNTER — Encounter (HOSPITAL_COMMUNITY)
Admission: RE | Disposition: A | Discharge status: Home Health | Payer: Self-pay | Source: Ambulatory Visit | Attending: Orthopedic Surgery

## 2016-08-13 ENCOUNTER — Encounter (HOSPITAL_COMMUNITY): Payer: Self-pay | Admitting: *Deleted

## 2016-08-13 DIAGNOSIS — Z9884 Bariatric surgery status: Secondary | ICD-10-CM | POA: Diagnosis not present

## 2016-08-13 DIAGNOSIS — I10 Essential (primary) hypertension: Secondary | ICD-10-CM | POA: Diagnosis present

## 2016-08-13 DIAGNOSIS — M2022 Hallux rigidus, left foot: Secondary | ICD-10-CM | POA: Diagnosis present

## 2016-08-13 DIAGNOSIS — Z8249 Family history of ischemic heart disease and other diseases of the circulatory system: Secondary | ICD-10-CM | POA: Diagnosis not present

## 2016-08-13 DIAGNOSIS — Z791 Long term (current) use of non-steroidal anti-inflammatories (NSAID): Secondary | ICD-10-CM

## 2016-08-13 DIAGNOSIS — Z79899 Other long term (current) drug therapy: Secondary | ICD-10-CM

## 2016-08-13 DIAGNOSIS — M19011 Primary osteoarthritis, right shoulder: Secondary | ICD-10-CM | POA: Diagnosis present

## 2016-08-13 DIAGNOSIS — Z836 Family history of other diseases of the respiratory system: Secondary | ICD-10-CM | POA: Diagnosis not present

## 2016-08-13 DIAGNOSIS — D62 Acute posthemorrhagic anemia: Secondary | ICD-10-CM

## 2016-08-13 DIAGNOSIS — I214 Non-ST elevation (NSTEMI) myocardial infarction: Secondary | ICD-10-CM | POA: Diagnosis present

## 2016-08-13 DIAGNOSIS — M5489 Other dorsalgia: Secondary | ICD-10-CM | POA: Diagnosis present

## 2016-08-13 DIAGNOSIS — Z96652 Presence of left artificial knee joint: Secondary | ICD-10-CM | POA: Diagnosis present

## 2016-08-13 DIAGNOSIS — F329 Major depressive disorder, single episode, unspecified: Secondary | ICD-10-CM | POA: Diagnosis present

## 2016-08-13 DIAGNOSIS — Z91041 Radiographic dye allergy status: Secondary | ICD-10-CM | POA: Diagnosis not present

## 2016-08-13 DIAGNOSIS — Z885 Allergy status to narcotic agent status: Secondary | ICD-10-CM | POA: Diagnosis not present

## 2016-08-13 DIAGNOSIS — I252 Old myocardial infarction: Secondary | ICD-10-CM | POA: Diagnosis not present

## 2016-08-13 DIAGNOSIS — R269 Unspecified abnormalities of gait and mobility: Secondary | ICD-10-CM

## 2016-08-13 DIAGNOSIS — K219 Gastro-esophageal reflux disease without esophagitis: Secondary | ICD-10-CM | POA: Diagnosis present

## 2016-08-13 DIAGNOSIS — E785 Hyperlipidemia, unspecified: Secondary | ICD-10-CM | POA: Diagnosis present

## 2016-08-13 DIAGNOSIS — I517 Cardiomegaly: Secondary | ICD-10-CM | POA: Diagnosis present

## 2016-08-13 DIAGNOSIS — Z969 Presence of functional implant, unspecified: Secondary | ICD-10-CM

## 2016-08-13 DIAGNOSIS — M549 Dorsalgia, unspecified: Secondary | ICD-10-CM | POA: Diagnosis present

## 2016-08-13 DIAGNOSIS — M19012 Primary osteoarthritis, left shoulder: Secondary | ICD-10-CM | POA: Diagnosis present

## 2016-08-13 DIAGNOSIS — Z981 Arthrodesis status: Secondary | ICD-10-CM | POA: Diagnosis not present

## 2016-08-13 DIAGNOSIS — M1711 Unilateral primary osteoarthritis, right knee: Secondary | ICD-10-CM | POA: Diagnosis present

## 2016-08-13 DIAGNOSIS — G5600 Carpal tunnel syndrome, unspecified upper limb: Secondary | ICD-10-CM | POA: Diagnosis present

## 2016-08-13 DIAGNOSIS — E669 Obesity, unspecified: Secondary | ICD-10-CM | POA: Diagnosis present

## 2016-08-13 DIAGNOSIS — D638 Anemia in other chronic diseases classified elsewhere: Secondary | ICD-10-CM

## 2016-08-13 DIAGNOSIS — Z806 Family history of leukemia: Secondary | ICD-10-CM | POA: Diagnosis not present

## 2016-08-13 DIAGNOSIS — M19049 Primary osteoarthritis, unspecified hand: Secondary | ICD-10-CM | POA: Diagnosis present

## 2016-08-13 DIAGNOSIS — Z6841 Body Mass Index (BMI) 40.0 and over, adult: Secondary | ICD-10-CM

## 2016-08-13 HISTORY — DX: Anemia in other chronic diseases classified elsewhere: D63.8

## 2016-08-13 HISTORY — DX: Presence of functional implant, unspecified: Z96.9

## 2016-08-13 HISTORY — DX: Acute posthemorrhagic anemia: D62

## 2016-08-13 HISTORY — PX: TOTAL KNEE ARTHROPLASTY: SHX125

## 2016-08-13 HISTORY — DX: Unilateral primary osteoarthritis, right knee: M17.11

## 2016-08-13 HISTORY — DX: Presence of left artificial knee joint: Z96.652

## 2016-08-13 LAB — CBC
HCT: 33.2 % — ABNORMAL LOW (ref 36.0–46.0)
HEMOGLOBIN: 10.3 g/dL — AB (ref 12.0–15.0)
MCH: 26 pg (ref 26.0–34.0)
MCHC: 31 g/dL (ref 30.0–36.0)
MCV: 83.8 fL (ref 78.0–100.0)
PLATELETS: 251 10*3/uL (ref 150–400)
RBC: 3.96 MIL/uL (ref 3.87–5.11)
RDW: 14.6 % (ref 11.5–15.5)
WBC: 10.7 10*3/uL — AB (ref 4.0–10.5)

## 2016-08-13 LAB — CREATININE, SERUM: CREATININE: 0.98 mg/dL (ref 0.44–1.00)

## 2016-08-13 SURGERY — ARTHROPLASTY, KNEE, TOTAL
Anesthesia: General | Laterality: Right

## 2016-08-13 MED ORDER — SUGAMMADEX SODIUM 200 MG/2ML IV SOLN
INTRAVENOUS | Status: DC | PRN
  Administered 2016-08-13: 200 mg via INTRAVENOUS

## 2016-08-13 MED ORDER — OXYCODONE HCL 5 MG/5ML PO SOLN: 5.0000 mg | Freq: Once | ORAL | Status: AC | PRN

## 2016-08-13 MED ORDER — LIDOCAINE 2% (20 MG/ML) 5 ML SYRINGE
INTRAMUSCULAR | Status: DC | PRN
  Administered 2016-08-13: 80 mg via INTRAVENOUS

## 2016-08-13 MED ORDER — ACETAMINOPHEN 325 MG PO TABS: 650.0000 mg | ORAL_TABLET | Freq: Four times a day (QID) | ORAL | Status: DC | PRN

## 2016-08-13 MED ORDER — FENTANYL CITRATE (PF) 100 MCG/2ML IJ SOLN
INTRAMUSCULAR | Status: AC
  Filled 2016-08-13: qty 2

## 2016-08-13 MED ORDER — FENTANYL CITRATE (PF) 100 MCG/2ML IJ SOLN
INTRAMUSCULAR | Status: AC
  Filled 2016-08-13: qty 4

## 2016-08-13 MED ORDER — HYDROMORPHONE HCL 2 MG PO TABS
2.0000 mg | ORAL_TABLET | ORAL | Status: DC | PRN
  Administered 2016-08-13 – 2016-08-15 (×11): 4 mg via ORAL
  Filled 2016-08-13 (×11): qty 2

## 2016-08-13 MED ORDER — CELECOXIB 200 MG PO CAPS
200.0000 mg | ORAL_CAPSULE | Freq: Two times a day (BID) | ORAL | Status: DC
  Administered 2016-08-13 – 2016-08-15 (×4): 200 mg via ORAL
  Filled 2016-08-13 (×4): qty 1

## 2016-08-13 MED ORDER — CEFAZOLIN SODIUM-DEXTROSE 2-4 GM/100ML-% IV SOLN
2.0000 g | Freq: Four times a day (QID) | INTRAVENOUS | Status: AC
  Administered 2016-08-13 (×2): 2 g via INTRAVENOUS
  Filled 2016-08-13 (×2): qty 100

## 2016-08-13 MED ORDER — SUGAMMADEX SODIUM 200 MG/2ML IV SOLN
INTRAVENOUS | Status: AC
  Filled 2016-08-13: qty 2

## 2016-08-13 MED ORDER — BUPIVACAINE-EPINEPHRINE (PF) 0.5% -1:200000 IJ SOLN
INTRAMUSCULAR | Status: DC | PRN
  Administered 2016-08-13: 30 mL via PERINEURAL

## 2016-08-13 MED ORDER — OXYCODONE HCL 5 MG PO TABS
ORAL_TABLET | ORAL | Status: AC
  Filled 2016-08-13: qty 1

## 2016-08-13 MED ORDER — FLUTICASONE PROPIONATE 50 MCG/ACT NA SUSP
2.0000 | Freq: Every day | NASAL | Status: DC | PRN
  Filled 2016-08-13: qty 16

## 2016-08-13 MED ORDER — CEFUROXIME SODIUM 750 MG IJ SOLR
INTRAMUSCULAR | Status: AC
  Filled 2016-08-13: qty 1500

## 2016-08-13 MED ORDER — FENTANYL CITRATE (PF) 100 MCG/2ML IJ SOLN
100.0000 ug | Freq: Once | INTRAMUSCULAR | Status: AC
Start: 2016-08-13 — End: 2016-08-13
  Administered 2016-08-13: 100 ug via INTRAVENOUS

## 2016-08-13 MED ORDER — DEXAMETHASONE SODIUM PHOSPHATE 10 MG/ML IJ SOLN
INTRAMUSCULAR | Status: AC
  Filled 2016-08-13: qty 1

## 2016-08-13 MED ORDER — ACETAMINOPHEN 650 MG RE SUPP
650.0000 mg | Freq: Four times a day (QID) | RECTAL | Status: DC | PRN
Start: 2016-08-13 — End: 2016-08-15

## 2016-08-13 MED ORDER — GABAPENTIN 300 MG PO CAPS
300.0000 mg | ORAL_CAPSULE | Freq: Two times a day (BID) | ORAL | Status: DC
  Administered 2016-08-13 – 2016-08-15 (×5): 300 mg via ORAL
  Filled 2016-08-13 (×4): qty 1

## 2016-08-13 MED ORDER — PHENOL 1.4 % MT LIQD: 1.0000 | OROMUCOSAL | Status: DC | PRN

## 2016-08-13 MED ORDER — DIPHENHYDRAMINE HCL 12.5 MG/5ML PO ELIX: 12.5000 mg | ORAL_SOLUTION | ORAL | Status: DC | PRN

## 2016-08-13 MED ORDER — LIDOCAINE 2% (20 MG/ML) 5 ML SYRINGE
INTRAMUSCULAR | Status: AC
  Filled 2016-08-13: qty 5

## 2016-08-13 MED ORDER — ONDANSETRON HCL 4 MG/2ML IJ SOLN: 4.0000 mg | Freq: Once | INTRAMUSCULAR | Status: DC | PRN

## 2016-08-13 MED ORDER — MEPERIDINE HCL 25 MG/ML IJ SOLN: 6.2500 mg | INTRAMUSCULAR | Status: DC | PRN

## 2016-08-13 MED ORDER — ONDANSETRON HCL 4 MG/2ML IJ SOLN
INTRAMUSCULAR | Status: AC
  Filled 2016-08-13: qty 2

## 2016-08-13 MED ORDER — ROCURONIUM BROMIDE 10 MG/ML (PF) SYRINGE
PREFILLED_SYRINGE | INTRAVENOUS | Status: AC
  Filled 2016-08-13: qty 10

## 2016-08-13 MED ORDER — SODIUM CHLORIDE 0.9 % IR SOLN
Status: DC | PRN
  Administered 2016-08-13: 1000 mL

## 2016-08-13 MED ORDER — ALUM & MAG HYDROXIDE-SIMETH 200-200-20 MG/5ML PO SUSP: 30.0000 mL | ORAL | Status: DC | PRN

## 2016-08-13 MED ORDER — SODIUM CHLORIDE 0.9 % IR SOLN
Status: DC | PRN
  Administered 2016-08-13: 3000 mL

## 2016-08-13 MED ORDER — MIDAZOLAM HCL 2 MG/2ML IJ SOLN
INTRAMUSCULAR | Status: AC
  Filled 2016-08-13: qty 2

## 2016-08-13 MED ORDER — ENOXAPARIN SODIUM 30 MG/0.3ML ~~LOC~~ SOLN
30.0000 mg | Freq: Two times a day (BID) | SUBCUTANEOUS | Status: DC
  Administered 2016-08-14 (×2): 30 mg via SUBCUTANEOUS
  Filled 2016-08-13 (×3): qty 0.3

## 2016-08-13 MED ORDER — EPHEDRINE SULFATE-NACL 50-0.9 MG/10ML-% IV SOSY
PREFILLED_SYRINGE | INTRAVENOUS | Status: DC | PRN
  Administered 2016-08-13: 5 mg via INTRAVENOUS

## 2016-08-13 MED ORDER — MIDAZOLAM HCL 2 MG/2ML IJ SOLN
INTRAMUSCULAR | Status: AC
  Administered 2016-08-13: 2 mg
  Filled 2016-08-13: qty 2

## 2016-08-13 MED ORDER — MIDAZOLAM HCL 2 MG/2ML IJ SOLN
INTRAMUSCULAR | Status: DC | PRN
  Administered 2016-08-13: 2 mg via INTRAVENOUS

## 2016-08-13 MED ORDER — HYDROMORPHONE HCL 1 MG/ML IJ SOLN
INTRAMUSCULAR | Status: AC
  Filled 2016-08-13: qty 0.5

## 2016-08-13 MED ORDER — CEFAZOLIN SODIUM-DEXTROSE 2-4 GM/100ML-% IV SOLN
2.0000 g | INTRAVENOUS | Status: AC
  Administered 2016-08-13: 2 g via INTRAVENOUS

## 2016-08-13 MED ORDER — PROPOFOL 10 MG/ML IV BOLUS
INTRAVENOUS | Status: AC
  Filled 2016-08-13: qty 40

## 2016-08-13 MED ORDER — ONDANSETRON HCL 4 MG PO TABS: 4.0000 mg | ORAL_TABLET | Freq: Four times a day (QID) | ORAL | Status: DC | PRN

## 2016-08-13 MED ORDER — DOCUSATE SODIUM 100 MG PO CAPS
100.0000 mg | ORAL_CAPSULE | Freq: Two times a day (BID) | ORAL | Status: DC
  Administered 2016-08-13 – 2016-08-15 (×4): 100 mg via ORAL
  Filled 2016-08-13 (×4): qty 1

## 2016-08-13 MED ORDER — ROCURONIUM BROMIDE 100 MG/10ML IV SOLN
INTRAVENOUS | Status: DC | PRN
  Administered 2016-08-13: 10 mg via INTRAVENOUS
  Administered 2016-08-13: 20 mg via INTRAVENOUS
  Administered 2016-08-13: 50 mg via INTRAVENOUS
  Administered 2016-08-13: 20 mg via INTRAVENOUS

## 2016-08-13 MED ORDER — BUPROPION HCL ER (XL) 150 MG PO TB24
150.0000 mg | ORAL_TABLET | Freq: Every day | ORAL | Status: DC
  Administered 2016-08-13 – 2016-08-15 (×3): 150 mg via ORAL
  Filled 2016-08-13 (×3): qty 1

## 2016-08-13 MED ORDER — MORPHINE SULFATE (PF) 2 MG/ML IV SOLN
2.0000 mg | INTRAVENOUS | Status: DC | PRN
  Administered 2016-08-13 (×3): 4 mg via INTRAVENOUS
  Administered 2016-08-14: 2 mg via INTRAVENOUS
  Administered 2016-08-14 (×5): 4 mg via INTRAVENOUS
  Filled 2016-08-13 (×4): qty 2
  Filled 2016-08-13: qty 1
  Filled 2016-08-13 (×4): qty 2

## 2016-08-13 MED ORDER — CEFAZOLIN SODIUM-DEXTROSE 2-4 GM/100ML-% IV SOLN
INTRAVENOUS | Status: AC
  Filled 2016-08-13: qty 100

## 2016-08-13 MED ORDER — BUPIVACAINE-EPINEPHRINE (PF) 0.5% -1:200000 IJ SOLN
INTRAMUSCULAR | Status: AC
  Filled 2016-08-13: qty 30

## 2016-08-13 MED ORDER — METOCLOPRAMIDE HCL 5 MG PO TABS: 5.0000 mg | ORAL_TABLET | Freq: Three times a day (TID) | ORAL | Status: DC | PRN

## 2016-08-13 MED ORDER — ONDANSETRON HCL 4 MG/2ML IJ SOLN
INTRAMUSCULAR | Status: DC | PRN
  Administered 2016-08-13: 4 mg via INTRAVENOUS

## 2016-08-13 MED ORDER — CEFUROXIME SODIUM 750 MG IJ SOLR
INTRAMUSCULAR | Status: DC | PRN
  Administered 2016-08-13 (×2): 750 mg

## 2016-08-13 MED ORDER — OXYCODONE HCL 5 MG PO TABS
5.0000 mg | ORAL_TABLET | Freq: Once | ORAL | Status: AC | PRN
  Administered 2016-08-13: 5 mg via ORAL

## 2016-08-13 MED ORDER — CITALOPRAM HYDROBROMIDE 40 MG PO TABS
40.0000 mg | ORAL_TABLET | Freq: Every day | ORAL | Status: DC
  Administered 2016-08-13 – 2016-08-15 (×3): 40 mg via ORAL
  Filled 2016-08-13 (×3): qty 1

## 2016-08-13 MED ORDER — EPHEDRINE 5 MG/ML INJ
INTRAVENOUS | Status: AC
  Filled 2016-08-13: qty 10

## 2016-08-13 MED ORDER — BUPIVACAINE-EPINEPHRINE (PF) 0.5% -1:200000 IJ SOLN
INTRAMUSCULAR | Status: DC | PRN
  Administered 2016-08-13: 30 mL

## 2016-08-13 MED ORDER — MIDAZOLAM HCL 2 MG/2ML IJ SOLN: 2.0000 mg | Freq: Once | INTRAMUSCULAR | Status: DC

## 2016-08-13 MED ORDER — HYDROMORPHONE HCL 1 MG/ML IJ SOLN
0.2500 mg | INTRAMUSCULAR | Status: DC | PRN
  Administered 2016-08-13 (×4): 0.5 mg via INTRAVENOUS

## 2016-08-13 MED ORDER — POTASSIUM CHLORIDE IN NACL 20-0.9 MEQ/L-% IV SOLN
INTRAVENOUS | Status: DC
  Administered 2016-08-14: 06:00:00 via INTRAVENOUS
  Filled 2016-08-13: qty 1000

## 2016-08-13 MED ORDER — LACTATED RINGERS IV SOLN
INTRAVENOUS | Status: DC
  Administered 2016-08-13 (×4): via INTRAVENOUS

## 2016-08-13 MED ORDER — HYDROMORPHONE HCL 1 MG/ML IJ SOLN
INTRAMUSCULAR | Status: AC
  Filled 2016-08-13: qty 1.5

## 2016-08-13 MED ORDER — METOCLOPRAMIDE HCL 5 MG/ML IJ SOLN: 5.0000 mg | Freq: Three times a day (TID) | INTRAMUSCULAR | Status: DC | PRN

## 2016-08-13 MED ORDER — POLYETHYLENE GLYCOL 3350 17 G PO PACK
17.0000 g | PACK | Freq: Two times a day (BID) | ORAL | Status: DC
  Administered 2016-08-13 – 2016-08-15 (×4): 17 g via ORAL
  Filled 2016-08-13 (×5): qty 1

## 2016-08-13 MED ORDER — BUPIVACAINE HCL 0.5 % IJ SOLN
INTRAMUSCULAR | Status: AC
  Filled 2016-08-13: qty 1

## 2016-08-13 MED ORDER — CHLORHEXIDINE GLUCONATE 4 % EX LIQD: 60.0000 mL | Freq: Once | CUTANEOUS | Status: DC

## 2016-08-13 MED ORDER — PROPOFOL 10 MG/ML IV BOLUS
INTRAVENOUS | Status: DC | PRN
  Administered 2016-08-13: 200 mg via INTRAVENOUS

## 2016-08-13 MED ORDER — EPINEPHRINE PF 1 MG/ML IJ SOLN
INTRAMUSCULAR | Status: AC
  Filled 2016-08-13: qty 1

## 2016-08-13 MED ORDER — FENTANYL CITRATE (PF) 100 MCG/2ML IJ SOLN
INTRAMUSCULAR | Status: DC | PRN
  Administered 2016-08-13 (×2): 50 ug via INTRAVENOUS
  Administered 2016-08-13 (×2): 100 ug via INTRAVENOUS

## 2016-08-13 MED ORDER — LORATADINE 10 MG PO TABS
10.0000 mg | ORAL_TABLET | Freq: Every day | ORAL | Status: DC
  Administered 2016-08-13 – 2016-08-15 (×3): 10 mg via ORAL
  Filled 2016-08-13 (×3): qty 1

## 2016-08-13 MED ORDER — PANTOPRAZOLE SODIUM 40 MG PO TBEC
40.0000 mg | DELAYED_RELEASE_TABLET | Freq: Every day | ORAL | Status: DC
  Administered 2016-08-13 – 2016-08-15 (×3): 40 mg via ORAL
  Filled 2016-08-13 (×3): qty 1

## 2016-08-13 MED ORDER — POVIDONE-IODINE 7.5 % EX SOLN: Freq: Once | CUTANEOUS | Status: DC

## 2016-08-13 MED ORDER — MENTHOL 3 MG MT LOZG: 1.0000 | LOZENGE | OROMUCOSAL | Status: DC | PRN

## 2016-08-13 MED ORDER — DEXAMETHASONE SODIUM PHOSPHATE 10 MG/ML IJ SOLN
INTRAMUSCULAR | Status: DC | PRN
  Administered 2016-08-13: 10 mg via INTRAVENOUS

## 2016-08-13 MED ORDER — ONDANSETRON HCL 4 MG/2ML IJ SOLN: 4.0000 mg | Freq: Four times a day (QID) | INTRAMUSCULAR | Status: DC | PRN

## 2016-08-13 MED ORDER — LACTATED RINGERS IV SOLN: INTRAVENOUS | Status: DC

## 2016-08-13 MED ORDER — DEXAMETHASONE SODIUM PHOSPHATE 10 MG/ML IJ SOLN
10.0000 mg | Freq: Three times a day (TID) | INTRAMUSCULAR | Status: AC
  Administered 2016-08-13 – 2016-08-14 (×4): 10 mg via INTRAVENOUS
  Filled 2016-08-13 (×4): qty 1

## 2016-08-13 SURGICAL SUPPLY — 75 items
APL SKNCLS STERI-STRIP NONHPOA (GAUZE/BANDAGES/DRESSINGS) ×1
BANDAGE ESMARK 6X9 LF (GAUZE/BANDAGES/DRESSINGS) ×1 IMPLANT
BENZOIN TINCTURE PRP APPL 2/3 (GAUZE/BANDAGES/DRESSINGS) ×2 IMPLANT
BLADE SAGITTAL 25.0X1.19X90 (BLADE) ×2 IMPLANT
BLADE SAW SGTL 13X75X1.27 (BLADE) ×2 IMPLANT
BLADE SURG 10 STRL SS (BLADE) ×4 IMPLANT
BNDG CMPR 9X6 STRL LF SNTH (GAUZE/BANDAGES/DRESSINGS) ×1
BNDG CMPR MED 15X6 ELC VLCR LF (GAUZE/BANDAGES/DRESSINGS) ×1
BNDG ELASTIC 6X15 VLCR STRL LF (GAUZE/BANDAGES/DRESSINGS) ×2 IMPLANT
BNDG ESMARK 6X9 LF (GAUZE/BANDAGES/DRESSINGS) ×2
BOWL SMART MIX CTS (DISPOSABLE) ×2 IMPLANT
CAPT KNEE TOTAL 3 ATTUNE ×1 IMPLANT
CEMENT HV SMART SET (Cement) ×4 IMPLANT
COVER SURGICAL LIGHT HANDLE (MISCELLANEOUS) ×2 IMPLANT
CUFF TOURNIQUET SINGLE 34IN LL (TOURNIQUET CUFF) ×2 IMPLANT
CUFF TOURNIQUET SINGLE 44IN (TOURNIQUET CUFF) IMPLANT
DECANTER SPIKE VIAL GLASS SM (MISCELLANEOUS) ×1 IMPLANT
DRAPE HALF SHEET 40X57 (DRAPES) ×2 IMPLANT
DRAPE INCISE IOBAN 66X45 STRL (DRAPES) ×2 IMPLANT
DRAPE PROXIMA HALF (DRAPES) ×2 IMPLANT
DRAPE U-SHAPE 47X51 STRL (DRAPES) ×2 IMPLANT
DRSG AQUACEL AG ADV 3.5X14 (GAUZE/BANDAGES/DRESSINGS) ×2 IMPLANT
DURAPREP 26ML APPLICATOR (WOUND CARE) ×4 IMPLANT
ELECT CAUTERY BLADE 6.4 (BLADE) ×2 IMPLANT
ELECT REM PT RETURN 9FT ADLT (ELECTROSURGICAL) ×2
ELECTRODE REM PT RTRN 9FT ADLT (ELECTROSURGICAL) ×1 IMPLANT
FACESHIELD WRAPAROUND (MASK) ×6 IMPLANT
FACESHIELD WRAPAROUND OR TEAM (MASK) ×1 IMPLANT
GLOVE BIO SURGEON STRL SZ 6.5 (GLOVE) ×4 IMPLANT
GLOVE BIO SURGEON STRL SZ7 (GLOVE) ×2 IMPLANT
GLOVE BIOGEL PI IND STRL 6 (GLOVE) IMPLANT
GLOVE BIOGEL PI IND STRL 6.5 (GLOVE) IMPLANT
GLOVE BIOGEL PI IND STRL 7.0 (GLOVE) ×1 IMPLANT
GLOVE BIOGEL PI IND STRL 7.5 (GLOVE) ×1 IMPLANT
GLOVE BIOGEL PI INDICATOR 6 (GLOVE) ×3
GLOVE BIOGEL PI INDICATOR 6.5 (GLOVE) ×4
GLOVE BIOGEL PI INDICATOR 7.0 (GLOVE) ×1
GLOVE BIOGEL PI INDICATOR 7.5 (GLOVE) ×1
GLOVE SS BIOGEL STRL SZ 7.5 (GLOVE) ×1 IMPLANT
GLOVE SUPERSENSE BIOGEL SZ 7.5 (GLOVE) ×1
GOWN STRL REUS W/ TWL LRG LVL3 (GOWN DISPOSABLE) ×1 IMPLANT
GOWN STRL REUS W/ TWL XL LVL3 (GOWN DISPOSABLE) ×2 IMPLANT
GOWN STRL REUS W/TWL LRG LVL3 (GOWN DISPOSABLE) ×2
GOWN STRL REUS W/TWL XL LVL3 (GOWN DISPOSABLE) ×4
HANDPIECE INTERPULSE COAX TIP (DISPOSABLE) ×2
HOOD PEEL AWAY FACE SHEILD DIS (HOOD) ×4 IMPLANT
IMMOBILIZER KNEE 22 (SOFTGOODS) ×1 IMPLANT
IMMOBILIZER KNEE 22 UNIV (SOFTGOODS) ×2 IMPLANT
KIT BASIN OR (CUSTOM PROCEDURE TRAY) ×2 IMPLANT
KIT ROOM TURNOVER OR (KITS) ×2 IMPLANT
MANIFOLD NEPTUNE II (INSTRUMENTS) ×2 IMPLANT
MARKER SKIN DUAL TIP RULER LAB (MISCELLANEOUS) ×2 IMPLANT
NDL 18GX1X1/2 (RX/OR ONLY) (NEEDLE) ×1 IMPLANT
NEEDLE 18GX1X1/2 (RX/OR ONLY) (NEEDLE) ×2 IMPLANT
NS IRRIG 1000ML POUR BTL (IV SOLUTION) ×2 IMPLANT
PACK TOTAL JOINT (CUSTOM PROCEDURE TRAY) ×2 IMPLANT
PAD ARMBOARD 7.5X6 YLW CONV (MISCELLANEOUS) ×4 IMPLANT
SET HNDPC FAN SPRY TIP SCT (DISPOSABLE) ×1 IMPLANT
STRIP CLOSURE SKIN 1/2X4 (GAUZE/BANDAGES/DRESSINGS) ×2 IMPLANT
SUCTION FRAZIER HANDLE 10FR (MISCELLANEOUS) ×1
SUCTION TUBE FRAZIER 10FR DISP (MISCELLANEOUS) ×1 IMPLANT
SUT MNCRL AB 3-0 PS2 18 (SUTURE) ×2 IMPLANT
SUT VIC AB 0 CT1 27 (SUTURE) ×6
SUT VIC AB 0 CT1 27XBRD ANBCTR (SUTURE) ×2 IMPLANT
SUT VIC AB 1 CT1 27 (SUTURE) ×2
SUT VIC AB 1 CT1 27XBRD ANBCTR (SUTURE) ×1 IMPLANT
SUT VIC AB 2-0 CT1 27 (SUTURE) ×4
SUT VIC AB 2-0 CT1 TAPERPNT 27 (SUTURE) ×2 IMPLANT
SYR 30ML LL (SYRINGE) ×2 IMPLANT
TOWEL OR 17X24 6PK STRL BLUE (TOWEL DISPOSABLE) ×2 IMPLANT
TOWEL OR 17X26 10 PK STRL BLUE (TOWEL DISPOSABLE) ×2 IMPLANT
TRAY FOLEY CATH 16FR SILVER (SET/KITS/TRAYS/PACK) ×2 IMPLANT
TUBE CONNECTING 12X1/4 (SUCTIONS) ×2 IMPLANT
TUBE CONNECTING 20X1/4 (TUBING) ×1 IMPLANT
YANKAUER SUCT BULB TIP NO VENT (SUCTIONS) ×3 IMPLANT

## 2016-08-13 NOTE — OR Nursing (Signed)
Patient has known allergy to contrast media; Verified during pre-op interview and topical betadine is ok for use; no reaction noted.

## 2016-08-13 NOTE — Anesthesia Postprocedure Evaluation (Signed)
Anesthesia Post Note  Patient: Nicole [name]  Procedure(s) Performed: Procedure(s) (LRB): RIGHT TOTAL KNEE ARTHROPLASTY (Right)  Patient location during evaluation: PACU Anesthesia Type: General Level of consciousness: awake and alert Pain management: pain level controlled Vital Signs Assessment: post-procedure vital signs reviewed and stable Respiratory status: spontaneous breathing, nonlabored ventilation, respiratory function stable and patient connected to nasal cannula oxygen Cardiovascular status: blood pressure returned to baseline and stable Postop Assessment: no signs of nausea or vomiting Anesthetic complications: no    Last Vitals:  Vitals:   08/13/16 1130 08/13/16 1145  BP: (!) 151/79 (!) 147/73  Pulse: 97 82  Resp: 12 10  Temp: 36.5 C     Last Pain:  Vitals:   08/13/16 1159  TempSrc:   PainSc: 8                  [name]

## 2016-08-13 NOTE — Anesthesia Procedure Notes (Addendum)
Procedure Name: Intubation Date/Time: 08/13/2016 9:09 AM Performed by: Trixie Deis A Pre-anesthesia Checklist: Patient identified, Emergency Drugs available, Suction available and Patient being monitored Patient Re-evaluated:Patient Re-evaluated prior to inductionOxygen Delivery Method: Circle System Utilized Preoxygenation: Pre-oxygenation with 100% oxygen Intubation Type: IV induction Ventilation: Mask ventilation without difficulty Laryngoscope Size: Mac and 3 Grade View: Grade I Tube type: Oral Tube size: 7.0 mm Number of attempts: 1 Airway Equipment and Method: Stylet and Oral airway Placement Confirmation: ETT inserted through vocal cords under direct vision,  positive ETCO2 and breath sounds checked- equal and bilateral Secured at: 21 cm Tube secured with: Tape Dental Injury: Teeth and Oropharynx as per pre-operative assessment

## 2016-08-13 NOTE — Interval H&P Note (Signed)
History and Physical Interval Note:  08/13/2016 8:56 AM  Nicole [name]  has presented today for surgery, with the diagnosis of Primary localized OA right knee  The various methods of treatment have been discussed with the patient and family. After consideration of risks, benefits and other options for treatment, the patient has consented to  Procedure(s): TOTAL KNEE ARTHROPLASTY (Right) as a surgical intervention .  The patient's history has been reviewed, patient examined, no change in status, stable for surgery.  I have reviewed the patient's chart and labs.  Questions were answered to the patient's satisfaction.     [name]

## 2016-08-13 NOTE — Anesthesia Procedure Notes (Addendum)
Anesthesia Regional Block:  Adductor canal block  Pre-Anesthetic Checklist: ,, timeout performed, Correct Patient, Correct Site, Correct Laterality, Correct Procedure, Correct Position, site marked, Risks and benefits discussed,  Surgical consent,  Pre-op evaluation,  At surgeon's request and post-op pain management  Laterality: Right and Lower  Prep: chloraprep       Needles:  Injection technique: Single-shot  Needle Type: Echogenic Needle     Needle Length: 9cm 9 cm Needle Gauge: 21 and 21 G    Additional Needles:  Procedures: ultrasound guided (picture in chart) Adductor canal block Narrative:  Start time: 08/13/2016 8:32 AM End time: 08/13/2016 8:37 AM Injection made incrementally with aspirations every 5 mL.  Performed by: Personally  Anesthesiologist: Aletta Edmunds       Right Adductor canal block image

## 2016-08-13 NOTE — Transfer of Care (Signed)
Immediate Anesthesia Transfer of Care Note  Patient: Nicole [name]  Procedure(s) Performed: Procedure(s): RIGHT TOTAL KNEE ARTHROPLASTY (Right)  Patient Location: PACU  Anesthesia Type:General  Level of Consciousness: awake, alert  and oriented  Airway & Oxygen Therapy: Patient Spontanous Breathing and Patient connected to nasal cannula oxygen  Post-op Assessment: Report given to RN, Post -op Vital signs reviewed and stable and Patient moving all extremities  Post vital signs: Reviewed and stable  Last Vitals:  Vitals:   08/13/16 0801 08/13/16 1130  BP: (!) 114/47 (!) 151/79  Pulse: 65 97  Resp: 20 12  Temp: 36.6 C 36.5 C    Last Pain:  Vitals:   08/13/16 0801  TempSrc: Oral      Patients Stated Pain Goal: 3 ([id number])  Complications: No apparent anesthesia complications

## 2016-08-13 NOTE — Progress Notes (Signed)
Orthopedic Tech Progress Note Patient Details:  Nicole [name] 1955-11-25 [id number]  OHF w/ Trapeze. CPM Right Knee CPM Right Knee: On Right Knee Flexion (Degrees): 90 Right Knee Extension (Degrees): 0  Ortho Devices Ortho Device/Splint Location: Footsie Roll   Charlott Rakes 08/13/2016, 12:45 PM

## 2016-08-13 NOTE — Op Note (Signed)
MRN:     [id number] DOB/AGE:    58-21-1957 / 58 y.o.       OPERATIVE REPORT    DATE OF PROCEDURE:  08/13/2016       PREOPERATIVE DIAGNOSIS:   Primary localizedOA right knee      Estimated body mass index is 39.16 kg/m as calculated from the following:   Height as of this encounter: 5\' 7"  (1.702 m).   Weight as of this encounter: 113.4 kg (250 lb).                                                        POSTOPERATIVE DIAGNOSIS:   Same                                                                      PROCEDURE:  Procedure(s): RIGHT TOTAL KNEE ARTHROPLASTY Using Depuy Attune RP implants #6narrow Femur, #5Tibia, 34mm  RP bearing, 32 Patella     SURGEON: Ethanjames Fontenot A    ASSISTANT:  Kirstin Shepperson PA-C   (Present and scrubbed throughout the case, critical for assistance with exposure, retraction, instrumentation, and closure.)         ANESTHESIA: GET with Adductor Nerve Block     TOURNIQUET TIME: [id number]   COMPLICATIONS:  None     SPECIMENS: None   INDICATIONS FOR PROCEDURE: The patient has  djd right knee, varus deformities, XR shows bone on bone arthritis. Patient has failed all conservative measures including anti-inflammatory medicines, narcotics, attempts at  exercise and weight loss, cortisone injections and viscosupplementation.  Risks and benefits of surgery have been discussed, questions answered.   DESCRIPTION OF PROCEDURE: The patient identified by armband, received  right femoral nerve block and IV antibiotics, in the holding area at [facility]. Patient taken to the operating room, appropriate anesthetic  monitors were attached General endotracheal anesthesia induced with  the patient in supine position, Foley catheter was inserted. Tourniquet  applied high to the operative thigh. Lateral post and foot positioner  applied to the table, the lower extremity was then prepped and draped  in usual sterile fashion from the ankle to the tourniquet. Time-out  procedure was performed. The limb was wrapped with an Esmarch bandage and the tourniquet inflated to 365 mmHg. We began the operation by making the anterior midline incision starting at handbreadth above the patella going over the patella 1 cm medial to and  4 cm distal to the tibial tubercle. Small bleeders in the skin and the  subcutaneous tissue identified and cauterized. Transverse retinaculum was incised and reflected medially and a medial parapatellar arthrotomy was accomplished. the patella was everted and theprepatellar fat pad resected. The superficial medial collateral  ligament was then elevated from anterior to posterior along the proximal  flare of the tibia and anterior half of the menisci resected. The knee was hyperflexed exposing bone on bone arthritis. Peripheral and notch osteophytes as well as the cruciate ligaments were then resected. We continued to  work our way around posteriorly along the proximal tibia, and externally  rotated the tibia subluxing it  out from underneath the femur. A McHale  retractor was placed through the notch and a lateral Hohmann retractor  placed, and we then drilled through the proximal tibia in line with the  axis of the tibia followed by an intramedullary guide rod and 2-degree  posterior slope cutting guide. The tibial cutting guide was pinned into place  allowing resection of 4 mm of bone medially and about 6 mm of bone  laterally because of her varus deformity. Satisfied with the tibial resection, we then  entered the distal femur 2 mm anterior to the PCL origin with the  intramedullary guide rod and applied the distal femoral cutting guide  set at 58mm, with 5 degrees of valgus. This was pinned along the  epicondylar axis. At this point, the distal femoral cut was accomplished without difficulty. We then sized for a #6 narrow femoral component and pinned the guide in 3 degrees of external rotation.The chamfer cutting guide was pinned into place.  The anterior, posterior, and chamfer cuts were accomplished without difficulty followed by  the  RP box cutting guide and the box cut. We also removed posterior osteophytes from the posterior femoral condyles. At this  time, the knee was brought into full extension. We checked our  extension and flexion gaps and found them symmetric at 58mm.  The patella thickness measured at 25 mm. We set the cutting guide at 15 and removed the posterior 9.5-10 mm  of the patella sized for 32 button and drilled the lollipop. The knee  was then once again hyperflexed exposing the proximal tibia. We sized for a #5 tibial base plate, applied the smokestack and the conical reamer followed by the the Delta fin keel punch. We then hammered into place the  RP trial femoral component, inserted a 1 trial bearing, trial patellar button, and took the knee through range of motion from 0-130 degrees. No thumb pressure was required for patellar  tracking. At this point, all trial components were removed, a double batch of DePuy HV cement with 1500 mg of Zinacef was mixed and applied to all bony metallic mating surfaces except for the posterior condyles of the femur itself. In order, we  hammered into place the tibial tray and removed excess cement, the femoral component and removed excess cement, a 56mm  RP bearing  was inserted, and the knee brought to full extension with compression.  The patellar button was clamped into place, and excess cement  removed. While the cement cured the wound was irrigated out with normal saline solution pulse lavage.. Ligament stability and patellar tracking were checked and found to be excellent.. The parapatellar arthrotomy was closed with  #1 Vicryl suture. The subcutaneous tissue with 0 and 2-0 undyed  Vicryl suture, and 4-0 Monocryl.. A dressing of Aquaseal,  4 x 4, dressing sponges, Webril, and Ace wrap applied. Needle and sponge count were correct times 2.The patient awakened, extubated, and  taken to recovery room without difficulty. Vascular status was normal, pulses 2+ and symmetric.   Nicole [name] A 08/13/2016, 10:51 AM

## 2016-08-13 NOTE — Anesthesia Preprocedure Evaluation (Signed)
Anesthesia Evaluation  Patient identified by MRN, date of birth, ID band Patient awake    Reviewed: Allergy & Precautions, NPO status , Patient's Chart, lab work & pertinent test results  Airway Mallampati: I  TM Distance: >3 FB Neck ROM: Full    Dental  (+) Teeth Intact, Dental Advisory Given   Pulmonary    breath sounds clear to auscultation       Cardiovascular hypertension, Pt. on medications Past MI: CAth was clean in 1/17.   Rhythm:Regular Rate:Normal     Neuro/Psych    GI/Hepatic GERD  Medicated and Controlled,  Endo/Other  Morbid obesity  Renal/GU      Musculoskeletal   Abdominal   Peds  Hematology   Anesthesia Other Findings   Reproductive/Obstetrics                             Anesthesia Physical Anesthesia Plan  ASA: III  Anesthesia Plan: General   Post-op Pain Management: GA combined w/ Regional for post-op pain   Induction: Intravenous  Airway Management Planned: Oral ETT  Additional Equipment:   Intra-op Plan:   Post-operative Plan: Extubation in OR  Informed Consent: I have reviewed the patients History and Physical, chart, labs and discussed the procedure including the risks, benefits and alternatives for the proposed anesthesia with the patient or authorized representative who has indicated his/her understanding and acceptance.   Dental advisory given  Plan Discussed with: CRNA, Anesthesiologist and Surgeon  Anesthesia Plan Comments:         Anesthesia Quick Evaluation

## 2016-08-13 NOTE — Evaluation (Signed)
Physical Therapy Evaluation Patient Details Name: Nicole [name] MRN: [id number] DOB: 16-Sep-1956 Today's Date: 08/13/2016   History of Present Illness  Pt is a 60 y/o female with PMH of NSTEMI, HTN, and L TKA admitted following R TKA.  Clinical Impression  Pt tolerated therapy well.  Decreased independence with transfers and gait 2/2 pain.  Continue per POC    Follow Up Recommendations Home health PT;Supervision - Intermittent    Equipment Recommendations       Recommendations for Other Services       Precautions / Restrictions Precautions Precautions: Knee Restrictions Weight Bearing Restrictions: Yes RLE Weight Bearing: Weight bearing as tolerated      Mobility  Bed Mobility Overal bed mobility: Needs Assistance Bed Mobility: Supine to Sit;Sit to Supine     Supine to sit: Min assist Sit to supine: Min assist   General bed mobility comments: assist for RLE  Transfers Overall transfer level: Needs assistance Equipment used: Rolling walker (2 wheeled) Transfers: Sit to/from Stand Sit to Stand: Supervision            Ambulation/Gait Ambulation/Gait assistance: Supervision Ambulation Distance (Feet): 25 Feet Assistive device: Rolling walker (2 wheeled) Gait Pattern/deviations: Step-to pattern;Decreased step length - left;Decreased stance time - right;Decreased weight shift to right Gait velocity: decreased      Stairs            Wheelchair Mobility    Modified Rankin (Stroke Patients Only)       Balance Overall balance assessment: Needs assistance Sitting-balance support: No upper extremity supported Sitting balance-Leahy Scale: Fair     Standing balance support: Bilateral upper extremity supported;Single extremity supported Standing balance-Leahy Scale: Poor                               Pertinent Vitals/Pain Pain Assessment: 0-10 Pain Score: 4  Pain Location: R knee Pain Descriptors / Indicators: Aching Pain  Intervention(s): RN gave pain meds during session;Relaxation;Repositioned    Home Living Family/patient expects to be discharged to:: Private residence Living Arrangements: Children;Parent Available Help at Discharge: Family;Available 24 hours/day Type of Home: House Home Access: Stairs to enter Entrance Stairs-Rails: Right Entrance Stairs-Number of Steps: 4-5 Home Layout: Two level;Bed/bath upstairs Home Equipment: Walker - 2 wheels      Prior Function Level of Independence: Independent               Hand Dominance        Extremity/Trunk Assessment   Upper Extremity Assessment: Defer to OT evaluation           Lower Extremity Assessment: RLE deficits/detail RLE Deficits / Details: RLE with decrease strength and ROM at knee       Communication   Communication: No difficulties  Cognition Arousal/Alertness: Awake/alert Behavior During Therapy: WFL for tasks assessed/performed Overall Cognitive Status: Within Functional Limits for tasks assessed                      General Comments      Exercises     Assessment/Plan    PT Assessment Patient needs continued PT services  PT Problem List Decreased strength;Decreased range of motion;Decreased activity tolerance;Decreased balance;Decreased mobility;Decreased coordination;Pain          PT Treatment Interventions DME instruction;Gait training;Stair training;Functional mobility training;Therapeutic activities;Therapeutic exercise;Balance training;Patient/family education    PT Goals (Current goals can be found in the Care Plan section)  Acute Rehab PT Goals  Patient Stated Goal: To return home PT Goal Formulation: With patient Time For Goal Achievement: 08/20/16 Potential to Achieve Goals: Good    Frequency BID   Barriers to discharge Inaccessible home environment      Co-evaluation               End of Session Equipment Utilized During Treatment: Gait belt Activity Tolerance:  Patient tolerated treatment well Patient left: in bed;with call bell/phone within reach;with family/visitor present Nurse Communication: Mobility status;Patient requests pain meds         Time: [id number] PT Time Calculation (min) (ACUTE ONLY): 15 min   Charges:   PT Evaluation $PT Eval Low Complexity: 1 Procedure     PT G Codes:        Halley Shepheard E Penven-Crew 08/13/2016, 5:20 PM

## 2016-08-14 ENCOUNTER — Encounter (HOSPITAL_COMMUNITY): Payer: Self-pay | Admitting: Orthopedic Surgery

## 2016-08-14 LAB — BASIC METABOLIC PANEL
ANION GAP: 6 (ref 5–15)
BUN: 11 mg/dL (ref 6–20)
CALCIUM: 8.6 mg/dL — AB (ref 8.9–10.3)
CO2: 28 mmol/L (ref 22–32)
Chloride: 103 mmol/L (ref 101–111)
Creatinine, Ser: 0.76 mg/dL (ref 0.44–1.00)
GLUCOSE: 165 mg/dL — AB (ref 65–99)
POTASSIUM: 4.4 mmol/L (ref 3.5–5.1)
Sodium: 137 mmol/L (ref 135–145)

## 2016-08-14 LAB — CBC
HEMATOCRIT: 29.5 % — AB (ref 36.0–46.0)
HEMOGLOBIN: 9.2 g/dL — AB (ref 12.0–15.0)
MCH: 26.4 pg (ref 26.0–34.0)
MCHC: 31.2 g/dL (ref 30.0–36.0)
MCV: 84.8 fL (ref 78.0–100.0)
Platelets: 223 10*3/uL (ref 150–400)
RBC: 3.48 MIL/uL — AB (ref 3.87–5.11)
RDW: 15 % (ref 11.5–15.5)
WBC: 12.7 10*3/uL — AB (ref 4.0–10.5)

## 2016-08-14 NOTE — Progress Notes (Signed)
Subjective: 1 Day Post-Op Procedure(s) (LRB): RIGHT TOTAL KNEE ARTHROPLASTY (Right) Patient reports pain as 8 on 0-10 scale.    Objective: Vital signs in last 24 hours: Temp:  [97.7 F (36.5 C)-98.4 F (36.9 C)] 97.7 F (36.5 C) (10/31 0609) Pulse Rate:  [71-97] 71 (10/31 0609) Resp:  [10-18] 18 (10/31 0609) BP: (108-151)/(45-90) 112/76 (10/31 0609) SpO2:  [94 %-99 %] 94 % (10/31 0609)  Intake/Output from previous day: 10/30 0701 - 10/31 0700 In: 1760 [P.O.:360; I.V.:1400] Out: 2925 [Urine:2875; Blood:50] Intake/Output this shift: No intake/output data recorded.   Recent Labs  08/13/16 1259 08/14/16 0626  HGB 10.3* 9.2*    Recent Labs  08/13/16 1259 08/14/16 0626  WBC 10.7* 12.7*  RBC 3.96 3.48*  HCT 33.2* 29.5*  PLT 251 223    Recent Labs  08/13/16 1259 08/14/16 0626  NA  --  137  K  --  4.4  CL  --  103  CO2  --  28  BUN  --  11  CREATININE 0.98 0.76  GLUCOSE  --  165*  CALCIUM  --  8.6*   No results for input(s): LABPT, INR in the last 72 hours.  ABD soft Neurovascular intact Sensation intact distally Intact pulses distally Dorsiflexion/Plantar flexion intact Incision: dressing C/D/I  Assessment/Plan: 1 Day Post-Op Procedure(s) (LRB): RIGHT TOTAL KNEE ARTHROPLASTY (Right)  Principal Problem:   Primary localized osteoarthritis of right knee Active Problems:   Abnormality of gait   Back pain   Benign essential HTN   NSTEMI (non-ST elevated myocardial infarction) (HCC)   Obesity (BMI 35.0-39.9 without comorbidity)   Esophageal reflux   S/P total knee arthroplasty, left   Presence of retained hardware Broken screw from old [place] patella reallignment   Anemia, chronic disease   Primary localized osteoarthrosis of the knee, right  Advance diet Up with therapy D/C IV fluids  Nicole [name] J 08/14/2016, 9:24 AM

## 2016-08-14 NOTE — Progress Notes (Signed)
Orthopedic Tech Progress Note Patient Details:  NYA BALAM 14-Mar-1956 [id number]  Patient ID: [name], female   DOB: 12/10/1955, 60 y.o.   MRN: [id number] Applied CPM at 0-90.  Kristopher Oppenheim 08/14/2016, 6:48 AM

## 2016-08-14 NOTE — Progress Notes (Signed)
Physical Therapy Treatment Patient Details Name: Nicole [name] MRN: [id number] DOB: 1955/12/02 Today's Date: 08/14/2016    History of Present Illness Pt is a 60 y/o female with PMH of NSTEMI, HTN, and L TKA admitted following R TKA.    PT Comments    Patient continues to progress with mobility. Mod I/Supervision for all mobility this session. Tolerated increased gait distance without c/o increased pain. In CPM end of session. Current plan remains appropriate.   Follow Up Recommendations  Home health PT;Supervision - Intermittent     Equipment Recommendations  Rolling walker with 5" wheels;3in1 (PT)    Recommendations for Other Services       Precautions / Restrictions Precautions Precautions: Knee Precaution Comments: pt has good understanding  Restrictions Weight Bearing Restrictions: Yes RLE Weight Bearing: Weight bearing as tolerated    Mobility  Bed Mobility Overal bed mobility: Modified Independent Bed Mobility: Supine to Sit;Sit to Supine           General bed mobility comments: increased time and effort; HOB elevated  Transfers Overall transfer level: Needs assistance Equipment used: Rolling walker (2 wheeled) Transfers: Sit to/from Stand Sit to Stand: Supervision            Ambulation/Gait Ambulation/Gait assistance: Supervision Ambulation Distance (Feet): 350 Feet Assistive device: Rolling walker (2 wheeled) Gait Pattern/deviations: Step-through pattern Gait velocity: decreased   General Gait Details: pt with slightly antalgic gait with R knee extended initally; improved R knee flexion, posture, and WB with increased distance; pt with no c/o increased pain with mobility    Stairs            Wheelchair Mobility    Modified Rankin (Stroke Patients Only)       Balance     Sitting balance-Leahy Scale: Good       Standing balance-Leahy Scale: Poor                      Cognition Arousal/Alertness:  Awake/alert Behavior During Therapy: WFL for tasks assessed/performed Overall Cognitive Status: Within Functional Limits for tasks assessed                      Exercises      General Comments        Pertinent Vitals/Pain Pain Assessment: Faces Faces Pain Scale: Hurts a little bit Pain Location: R thigh/knee Pain Descriptors / Indicators: Sore Pain Intervention(s): Monitored during session;Premedicated before session;Repositioned    Home Living                      Prior Function            PT Goals (current goals can now be found in the care plan section) Acute Rehab PT Goals Patient Stated Goal: To return home PT Goal Formulation: With patient Time For Goal Achievement: 08/20/16 Potential to Achieve Goals: Good Progress towards PT goals: Progressing toward goals    Frequency    7X/week      PT Plan Current plan remains appropriate    Co-evaluation             End of Session Equipment Utilized During Treatment: Gait belt Activity Tolerance: Patient tolerated treatment well Patient left: with call bell/phone within reach;in bed;in CPM;with family/visitor present     Time: [id number] PT Time Calculation (min) (ACUTE ONLY): 20 min  Charges:  $Gait Training: 8-22 mins  G Codes:      Salina April, PTA Pager: [phone]   08/14/2016, 4:16 PM

## 2016-08-14 NOTE — Progress Notes (Signed)
Physical Therapy Treatment Patient Details Name: Nicole [name] MRN: [id number] DOB: 1956/08/07 Today's Date: 08/14/2016    History of Present Illness Pt is a 60 y/o female with PMH of NSTEMI, HTN, and L TKA admitted following R TKA.    PT Comments    Patient is progressing well toward mobility goals. Pt tolerated gait/stair training and therex this session. Continue to progress as tolerated with anticipated d/c home with HHPT.   Follow Up Recommendations  Home health PT;Supervision - Intermittent     Equipment Recommendations  Rolling walker with 5" wheels;3in1 (PT)    Recommendations for Other Services       Precautions / Restrictions Precautions Precautions: Knee Precaution Comments: pt has good understanding  Restrictions Weight Bearing Restrictions: Yes RLE Weight Bearing: Weight bearing as tolerated    Mobility  Bed Mobility               General bed mobility comments: pt OOB in chair upon arrival  Transfers Overall transfer level: Needs assistance Equipment used: Rolling walker (2 wheeled) Transfers: Sit to/from Stand Sit to Stand: Supervision            Ambulation/Gait Ambulation/Gait assistance: Supervision Ambulation Distance (Feet): 180 Feet Assistive device: Rolling walker (2 wheeled) Gait Pattern/deviations: Step-through pattern Gait velocity: decreased   General Gait Details: steady gait; minimal reliance on UE support; cues for increased knee flexion during swing phase   Stairs Stairs: Yes Stairs assistance: Min guard Stair Management: No rails;Backwards;With walker Number of Stairs: 4 General stair comments: cues for sequencing and technique  Wheelchair Mobility    Modified Rankin (Stroke Patients Only)       Balance     Sitting balance-Leahy Scale: Fair       Standing balance-Leahy Scale: Poor                      Cognition Arousal/Alertness: Awake/alert Behavior During Therapy: WFL for tasks  assessed/performed Overall Cognitive Status: Within Functional Limits for tasks assessed                      Exercises Total Joint Exercises Quad Sets: AROM;Both;10 reps;Seated Heel Slides: AROM;Seated;Right;10 reps Straight Leg Raises: AROM;Right;10 reps;Seated Long Arc Quad: AROM;Right;10 reps;Seated Goniometric ROM: 5-80    General Comments        Pertinent Vitals/Pain Pain Assessment: 0-10 Pain Score: 6  Pain Location: R thigh/knee with mobility Pain Descriptors / Indicators: Aching;Sore Pain Intervention(s): Limited activity within patient's tolerance;Monitored during session;Premedicated before session;Repositioned    Home Living                      Prior Function            PT Goals (current goals can now be found in the care plan section) Acute Rehab PT Goals Patient Stated Goal: To return home PT Goal Formulation: With patient Time For Goal Achievement: 08/20/16 Potential to Achieve Goals: Good Progress towards PT goals: Progressing toward goals    Frequency    7X/week      PT Plan Current plan remains appropriate    Co-evaluation             End of Session Equipment Utilized During Treatment: Gait belt Activity Tolerance: Patient tolerated treatment well Patient left: with call bell/phone within reach;in bed     Time: [id number] PT Time Calculation (min) (ACUTE ONLY): 24 min  Charges:  $Gait Training: 8-22 mins $Therapeutic Exercise: 8-22  mins                    G Codes:      Salina April, PTA Pager: [phone]   08/14/2016, 11:26 AM

## 2016-08-14 NOTE — Care Management Note (Signed)
Case Management Note  Patient Details  Name: Nicole [name] MRN: [id number] Date of Birth: May 20, 1956  Subjective/Objective:  60 yr old female s/p right total knee arthroplasty.                  Action/Plan: Case manager spoke with patient concerning [place] and DME needs. Patient was preoperatively setup with [facility], no changes. Patient states she thought she had a rolling walker but it is not useable,she does have a 3in1.  CM has ordered RW . Patient will have family support at discharge.   Expected Discharge Date:   11/ 1/17             Expected Discharge Plan:  [place]  In-House Referral:     Discharge planning Services  CM Consult  Post Acute Care Choice:  Home Health Choice offered to:  Patient  DME Arranged:  CPM, Walker rolling DME Agency:  [employer]  HH Arranged:  PT [place]:  [facility] (now [facility])  Status of Service:  Completed, signed off  If discussed at [employer], dates discussed:    Additional Comments:  Ninfa Meeker, RN 08/14/2016, 2:30 PM

## 2016-08-15 ENCOUNTER — Encounter (HOSPITAL_COMMUNITY): Payer: Self-pay | Admitting: Physician Assistant

## 2016-08-15 DIAGNOSIS — D62 Acute posthemorrhagic anemia: Secondary | ICD-10-CM

## 2016-08-15 HISTORY — DX: Acute posthemorrhagic anemia: D62

## 2016-08-15 LAB — BASIC METABOLIC PANEL
Anion gap: 7 (ref 5–15)
BUN: 14 mg/dL (ref 6–20)
CHLORIDE: 102 mmol/L (ref 101–111)
CO2: 28 mmol/L (ref 22–32)
Calcium: 8.6 mg/dL — ABNORMAL LOW (ref 8.9–10.3)
Creatinine, Ser: 0.8 mg/dL (ref 0.44–1.00)
GFR calc non Af Amer: >60 mL/min (ref >60)
Glucose, Bld: 143 mg/dL — ABNORMAL HIGH (ref 65–99)
POTASSIUM: 4.2 mmol/L (ref 3.5–5.1)
SODIUM: 137 mmol/L (ref 135–145)

## 2016-08-15 LAB — CBC
HEMATOCRIT: 26.8 % — AB (ref 36.0–46.0)
HEMOGLOBIN: 8.3 g/dL — AB (ref 12.0–15.0)
MCH: 25.9 pg — AB (ref 26.0–34.0)
MCHC: 31 g/dL (ref 30.0–36.0)
MCV: 83.8 fL (ref 78.0–100.0)
Platelets: 237 10*3/uL (ref 150–400)
RBC: 3.2 MIL/uL — AB (ref 3.87–5.11)
RDW: 14.7 % (ref 11.5–15.5)
WBC: 11.5 10*3/uL — ABNORMAL HIGH (ref 4.0–10.5)

## 2016-08-15 MED ORDER — HYDROMORPHONE HCL 2 MG PO TABS: ORAL_TABLET | ORAL | 0 refills | Status: AC

## 2016-08-15 MED ORDER — DOCUSATE SODIUM 100 MG PO CAPS: ORAL_CAPSULE | ORAL | 0 refills | Status: AC

## 2016-08-15 MED ORDER — POLYETHYLENE GLYCOL 3350 17 G PO PACK: PACK | ORAL | 0 refills | Status: DC

## 2016-08-15 MED ORDER — ACETAMINOPHEN 325 MG PO TABS: 650.0000 mg | ORAL_TABLET | Freq: Four times a day (QID) | ORAL | Status: AC | PRN

## 2016-08-15 MED ORDER — ENOXAPARIN SODIUM 30 MG/0.3ML ~~LOC~~ SOLN: 30.0000 mg | Freq: Two times a day (BID) | SUBCUTANEOUS | 0 refills | Status: AC

## 2016-08-15 NOTE — Progress Notes (Signed)
Orthopedic Tech Progress Note Patient Details:  Nicole [name] November 12, 1955 [id number]  Patient ID: Nicole [name], female   DOB: 12/25/1955, 60 y.o.   MRN: [id number] Applied cpm 0-90  Karolee Stamps 08/15/2016, 5:48 AM

## 2016-08-15 NOTE — Progress Notes (Signed)
Physical Therapy Treatment Patient Details Name: Nicole [name] MRN: [id number] DOB: 1956-06-10 Today's Date: 08/15/2016    History of Present Illness Pt is a 60 y/o female with PMH of NSTEMI, HTN, and L TKA admitted following R TKA.    PT Comments    Patient is making good progress with PT.  From a mobility standpoint anticipate patient will be ready for DC home when medically ready.     Follow Up Recommendations  Home health PT;Supervision - Intermittent     Equipment Recommendations  Rolling walker with 5" wheels;3in1 (PT)    Recommendations for Other Services       Precautions / Restrictions Precautions Precautions: Knee Precaution Comments: pt has good understanding  Restrictions Weight Bearing Restrictions: Yes RLE Weight Bearing: Weight bearing as tolerated    Mobility  Bed Mobility Overal bed mobility: Independent Bed Mobility: Supine to Sit;Sit to Supine              Transfers Overall transfer level: Modified independent Equipment used: Rolling walker (2 wheeled) Transfers: Sit to/from Stand Sit to Stand: Modified independent (Device/Increase time)         General transfer comment: safe hand placement and technique  Ambulation/Gait Ambulation/Gait assistance: Supervision Ambulation Distance (Feet): 250 Feet Assistive device: Rolling walker (2 wheeled) Gait Pattern/deviations: Step-through pattern Gait velocity: decreased   General Gait Details: cues for posture and R knee extension during stance phase; pt with improved knee flexion during swing phase and ability to WB on R LE   Stairs            Wheelchair Mobility    Modified Rankin (Stroke Patients Only)       Balance Overall balance assessment: Needs assistance Sitting-balance support: No upper extremity supported;Feet supported Sitting balance-Leahy Scale: Good     Standing balance support: No upper extremity supported;During functional activity Standing balance-Leahy  Scale: Fair                      Cognition Arousal/Alertness: Awake/alert Behavior During Therapy: WFL for tasks assessed/performed Overall Cognitive Status: Within Functional Limits for tasks assessed                      Exercises Total Joint Exercises Quad Sets: AROM;Right;15 reps;Supine Heel Slides: AROM;Right;15 reps;Supine Hip ABduction/ADduction: AROM;Right;15 reps;Supine Straight Leg Raises: AROM;Right;15 reps;Supine Long Arc Quad: AROM;Right;15 reps;Seated Knee Flexion: AROM;Right;10 reps;Seated;Other (comment) (10 sec holds) Goniometric ROM: 5-85    General Comments        Pertinent Vitals/Pain Pain Assessment: 0-10 Pain Score: 5  Faces Pain Scale: Hurts a little bit Pain Location: R thigh and knee Pain Descriptors / Indicators: Sore Pain Intervention(s): Limited activity within patient's tolerance;Monitored during session;Premedicated before session;Repositioned    Home Living Family/patient expects to be discharged to:: Private residence Living Arrangements: Children;Parent Available Help at Discharge: Family;Available 24 hours/day Type of Home: House Home Access: Stairs to enter Entrance Stairs-Rails: Right Home Layout: Two level;Bed/bath upstairs Home Equipment: None      Prior Function Level of Independence: Independent      Comments: Lexicographer   PT Goals (current goals can now be found in the care plan section) Acute Rehab PT Goals Patient Stated Goal: To return home PT Goal Formulation: With patient Time For Goal Achievement: 08/20/16 Potential to Achieve Goals: Good Progress towards PT goals: Progressing toward goals    Frequency    7X/week      PT Plan Current plan  remains appropriate    Co-evaluation             End of Session Equipment Utilized During Treatment: Gait belt Activity Tolerance: Patient tolerated treatment well Patient left: with call bell/phone within reach;in bed      Time: [id number] PT Time Calculation (min) (ACUTE ONLY): 24 min  Charges:  $Gait Training: 8-22 mins $Therapeutic Exercise: 8-22 mins                    G Codes:      Salina April, PTA Pager: [phone]   08/15/2016, 10:46 AM

## 2016-08-15 NOTE — Discharge Summary (Signed)
Patient ID: Nicole [name] MRN: [id number] DOB/AGE: June 05, 1956 60 y.o.  Admit date: 08/13/2016 Discharge date: 08/15/2016  Admission Diagnoses:  Principal Problem:   Primary localized osteoarthritis of right knee Active Problems:   Abnormality of gait   Back pain   Benign essential HTN   NSTEMI (non-ST elevated myocardial infarction) (HCC)   Obesity (BMI 35.0-39.9 without comorbidity)   Esophageal reflux   S/P total knee arthroplasty, left   Presence of retained hardware Broken screw from old [place] patella reallignment   Anemia, chronic disease   Primary localized osteoarthrosis of the knee, right   Postoperative anemia due to acute blood loss   Discharge Diagnoses:  Same  Past Medical History:  Diagnosis Date  . Allergy   . Anemia    iron deficiency  . Anemia, chronic disease 08/13/2016  . Anxiety   . Arthritis    hands, right knee, shoulders  . Back pain 07/25/2015   lower back pain  . Breast mass, left    benign fibroadenoma  . Cardiomegaly    on CXR  . Carpal tunnel syndrome    bilateral feet pain and numbness  . Cataract    surgery scheduled for right eye on 04/03/16  . Cellulitis and abscess of leg 11/2010   resolved - right leg  . Complication of anesthesia    blood pressure dropped after surgery  . Depression   . DJD (degenerative joint disease)   . GERD (gastroesophageal reflux disease)   . Hyperlipidemia   . Hypertension   . Iron deficiency anemia   . Myocardial infarction 10/2014   non-ST elevated, no cause found, pt states may have just been a spasm  . Obesity   . PONV (postoperative nausea and vomiting)    with just 1 surgery  . Postoperative anemia due to acute blood loss 08/15/2016  . Presence of retained hardware Broken screw from old [place] patella reallignment 07/31/2016  . Primary localized osteoarthritis of left knee   . Primary localized osteoarthritis of right knee   . S/P total knee arthroplasty, left 07/31/2016     Surgeries: Procedure(s): RIGHT TOTAL KNEE ARTHROPLASTY on 08/13/2016   Consultants:   Discharged Condition: Improved  Hospital Course: [name] is an 60 y.o. female who was admitted 08/13/2016 for operative treatment ofPrimary localized osteoarthritis of right knee. Patient has severe unremitting pain that affects sleep, daily activities, and work/hobbies. After pre-op clearance the patient was taken to the operating room on 08/13/2016 and underwent  Procedure(s): RIGHT TOTAL KNEE ARTHROPLASTY.    Patient was given perioperative antibiotics:  Anti-infectives    Start     Dose/Rate Route Frequency Ordered Stop   08/13/16 1400  ceFAZolin (ANCEF) IVPB 2g/100 mL premix     2 g 200 mL/hr over 30 Minutes Intravenous Every 6 hours 08/13/16 1251 08/13/16 2145   08/13/16 1036  cefUROXime (ZINACEF) injection  Status:  Discontinued       As needed 08/13/16 1037 08/13/16 1126   08/13/16 0805  ceFAZolin (ANCEF) 2-4 GM/100ML-% IVPB    Comments:  Precious Haws   : cabinet override      08/13/16 0805 08/13/16 0900   08/13/16 0800  ceFAZolin (ANCEF) IVPB 2g/100 mL premix     2 g 200 mL/hr over 30 Minutes Intravenous On call to O.R. 08/13/16 0800 08/13/16 0900       Patient was given sequential compression devices, early ambulation, and chemoprophylaxis to prevent DVT.  Patient benefited maximally from hospital stay and there  were no complications.    Recent vital signs:  Patient Vitals for the past 24 hrs:  BP Temp Temp src Pulse Resp SpO2  08/15/16 0412 111/67 97.9 F (36.6 C) Oral 69 16 96 %  08/14/16 2036 (!) 110/56 98 F (36.7 C) Oral 71 16 92 %  08/14/16 1500 118/74 97.7 F (36.5 C) Oral 74 18 98 %     Recent laboratory studies:   Recent Labs  08/14/16 0626 08/15/16 0621  WBC 12.7* 11.5*  HGB 9.2* 8.3*  HCT 29.5* 26.8*  PLT 223 237  NA 137 137  K 4.4 4.2  CL 103 102  CO2 28 28  BUN 11 14  CREATININE 0.76 0.80  GLUCOSE 165* 143*  CALCIUM 8.6* 8.6*      Discharge Medications:     Medication List    STOP taking these medications   meloxicam 15 MG tablet Commonly known as:  MOBIC     TAKE these medications   acetaminophen 325 MG tablet Commonly known as:  TYLENOL Take 2 tablets (650 mg total) by mouth every 6 (six) hours as needed for mild pain (or Fever >/= 101).   benazepril 10 MG tablet Commonly known as:  LOTENSIN Take 10 mg by mouth daily.   buPROPion 150 MG 24 hr tablet Commonly known as:  WELLBUTRIN XL Take 150 mg by mouth daily.   cetirizine 10 MG tablet Commonly known as:  ZYRTEC Take 10 mg by mouth daily.   citalopram 40 MG tablet Commonly known as:  CELEXA Take 40 mg by mouth daily.   docusate sodium 100 MG capsule Commonly known as:  COLACE 1 tab 2 times a day while on narcotics.  STOOL SOFTENER   enoxaparin 30 MG/0.3ML injection Commonly known as:  LOVENOX Inject 0.3 mLs (30 mg total) into the skin every 12 (twelve) hours.   fluticasone 50 MCG/ACT nasal spray Commonly known as:  FLONASE Place 2 sprays into both nostrils daily as needed for allergies or rhinitis.   gabapentin 300 MG capsule Commonly known as:  NEURONTIN Take 300 mg by mouth 2 (two) times daily.   HYDROmorphone 2 MG tablet Commonly known as:  DILAUDID 1-2 tablets every 4-6 hrs as needed for pain   omeprazole 20 MG capsule Commonly known as:  PRILOSEC Take 20 mg by mouth daily.   polyethylene glycol packet Commonly known as:  MIRALAX / GLYCOLAX 17grams in 6 oz of water twice a day until bowel movement.  LAXITIVE.  Restart if two days since last bowel movement       Diagnostic Studies: No results found.  Disposition: 01-Home or Self Care  Discharge Instructions    CPM    Complete by:  As directed    Continuous passive motion machine (CPM):      Use the CPM from 0 to 90 for 6 hours per day.       You may break it up into 2 or 3 sessions per day.      Use CPM for 2 weeks or until you are told to stop.   Call MD /  Call 911    Complete by:  As directed    If you experience chest pain or shortness of breath, CALL 911 and be transported to the hospital emergency room.  If you develope a fever above 101 F, pus (white drainage) or increased drainage or redness at the wound, or calf pain, call your surgeon's office.   Change dressing    Complete  by:  As directed    Change the gauze dressing daily with sterile 4 x 4 inch gauze and apply TED hose.  DO NOT REMOVE BANDAGE OVER SURGICAL INCISION.  [place] WHOLE LEG INCLUDING OVER THE WATERPROOF BANDAGE WITH SOAP AND WATER EVERY DAY.   Constipation Prevention    Complete by:  As directed    Drink plenty of fluids.  Prune juice may be helpful.  You may use a stool softener, such as Colace (over the counter) 100 mg twice a day.  Use MiraLax (over the counter) for constipation as needed.   Diet - low sodium heart healthy    Complete by:  As directed    Discharge instructions    Complete by:  As directed    INSTRUCTIONS AFTER JOINT REPLACEMENT   Remove items at home which could result in a fall. This includes throw rugs or furniture in walking pathways ICE to the affected joint every three hours while awake for 30 minutes at a time, for at least the first 3-5 days, and then as needed for pain and swelling.  Continue to use ice for pain and swelling. You may notice swelling that will progress down to the foot and ankle.  This is normal after surgery.  Elevate your leg when you are not up walking on it.   Continue to use the breathing machine you got in the hospital (incentive spirometer) which will help keep your temperature down.  It is common for your temperature to cycle up and down following surgery, especially at night when you are not up moving around and exerting yourself.  The breathing machine keeps your lungs expanded and your temperature down.   DIET:  As you were doing prior to hospitalization, we recommend a well-balanced diet.  DRESSING / WOUND CARE /  SHOWERING  Keep the surgical dressing until follow up.  The dressing is water proof, so you can shower without any extra covering.  IF THE DRESSING FALLS OFF or the wound gets wet inside, change the dressing with sterile gauze.  Please use good hand washing techniques before changing the dressing.  Do not use any lotions or creams on the incision until instructed by your surgeon.    ACTIVITY  Increase activity slowly as tolerated, but follow the weight bearing instructions below.   No driving for 6 weeks or until further direction given by your physician.  You cannot drive while taking narcotics.  No lifting or carrying greater than 10 lbs. until further directed by your surgeon. Avoid periods of inactivity such as sitting longer than an hour when not asleep. This helps prevent blood clots.  You may return to work once you are authorized by your doctor.     WEIGHT BEARING   Weight bearing as tolerated with assist device (walker, cane, etc) as directed, use it as long as suggested by your surgeon or therapist, typically at least 2-3 weeks.   EXERCISES  Results after joint replacement surgery are often greatly improved when you follow the exercise, range of motion and muscle strengthening exercises prescribed by your doctor. Safety measures are also important to protect the joint from further injury. Any time any of these exercises cause you to have increased pain or swelling, decrease what you are doing until you are comfortable again and then slowly increase them. If you have problems or questions, call your caregiver or physical therapist for advice.   Rehabilitation is important following a joint replacement. After just a few days of  immobilization, the muscles of the leg can become weakened and shrink (atrophy).  These exercises are designed to build up the tone and strength of the thigh and leg muscles and to improve motion. Often times heat used for twenty to thirty minutes before working  out will loosen up your tissues and help with improving the range of motion but do not use heat for the first two weeks following surgery (sometimes heat can increase post-operative swelling).   These exercises can be done on a training (exercise) mat, on the floor, on a table or on a bed. Use whatever works the best and is most comfortable for you.    Use music or television while you are exercising so that the exercises are a pleasant break in your day. This will make your life better with the exercises acting as a break in your routine that you can look forward to.   Perform all exercises about fifteen times, three times per day or as directed.  You should exercise both the operative leg and the other leg as well.   Exercises include:  Quad Sets - Tighten up the muscle on the front of the thigh (Quad) and hold for 5-10 seconds.   Straight Leg Raises - With your knee straight (if you were given a brace, keep it on), lift the leg to 60 degrees, hold for 3 seconds, and slowly lower the leg.  Perform this exercise against resistance later as your leg gets stronger.  Leg Slides: Lying on your back, slowly slide your foot toward your buttocks, bending your knee up off the floor (only go as far as is comfortable). Then slowly slide your foot back down until your leg is flat on the floor again.  Angel Wings: Lying on your back spread your legs to the side as far apart as you can without causing discomfort.  Hamstring Strength:  Lying on your back, push your heel against the floor with your leg straight by tightening up the muscles of your buttocks.  Repeat, but this time bend your knee to a comfortable angle, and push your heel against the floor.  You may put a pillow under the heel to make it more comfortable if necessary.   A rehabilitation program following joint replacement surgery can speed recovery and prevent re-injury in the future due to weakened muscles. Contact your doctor or a physical therapist  for more information on knee rehabilitation.    CONSTIPATION  Constipation is defined medically as fewer than three stools per week and severe constipation as less than one stool per week.  Even if you have a regular bowel pattern at home, your normal regimen is likely to be disrupted due to multiple reasons following surgery.  Combination of anesthesia, postoperative narcotics, change in appetite and fluid intake all can affect your bowels.   YOU MUST use at least one of the following options; they are listed in order of increasing strength to get the job done.  They are all available over the counter, and you may need to use some, POSSIBLY even all of these options:    Drink plenty of fluids (prune juice may be helpful) and high fiber foods Colace 100 mg by mouth twice a day  Senokot for constipation as directed and as needed Dulcolax (bisacodyl), take with full glass of water  Miralax (polyethylene glycol) once or twice a day as needed.  If you have tried all these things and are unable to have a bowel movement in  the first 3-4 days after surgery call either your surgeon or your primary doctor.    If you experience loose stools or diarrhea, hold the medications until you stool forms back up.  If your symptoms do not get better within 1 week or if they get worse, check with your doctor.  If you experience "the worst abdominal pain ever" or develop nausea or vomiting, please contact the office immediately for further recommendations for treatment.   ITCHING:  If you experience itching with your medications, try taking only a single pain pill, or even half a pain pill at a time.  You can also use Benadryl over the counter for itching or also to help with sleep.   TED HOSE STOCKINGS:  Use stockings on both legs until for at least 2 weeks or as directed by physician office. They may be removed at night for sleeping.  MEDICATIONS:  See your medication summary on the "After Visit Summary" that  nursing will review with you.  You may have some home medications which will be placed on hold until you complete the course of blood thinner medication.  It is important for you to complete the blood thinner medication as prescribed.  PRECAUTIONS:  If you experience chest pain or shortness of breath - call 911 immediately for transfer to the hospital emergency department.   If you develop a fever greater that 101 F, purulent drainage from wound, increased redness or drainage from wound, foul odor from the wound/dressing, or calf pain - CONTACT YOUR SURGEON.                                                   FOLLOW-UP APPOINTMENTS:  If you do not already have a post-op appointment, please call the office for an appointment to be seen by your surgeon.  Guidelines for how soon to be seen are listed in your "After Visit Summary", but are typically between 1-4 weeks after surgery.  OTHER INSTRUCTIONS:   Knee Replacement:  Do not place pillow under knee, focus on keeping the knee straight while resting. CPM instructions: 0-90 degrees, 2 hours in the morning, 2 hours in the afternoon, and 2 hours in the evening. Place foam block, curve side up under heel at all times except when in CPM or when walking.  DO NOT modify, tear, cut, or change the foam block in any way.  MAKE SURE YOU:  Understand these instructions.  Get help right away if you are not doing well or get worse.    Thank you for letting us be a part of your medical care team.  It is a privilege we respect greatly.  We hope these instructions will help you stay on track for a fast and full recovery!   Do not put a pillow under the knee. Place it under the heel.    Complete by:  As directed    Place gray foam block, curve side up under heel at all times except when in CPM or when walking.  DO NOT modify, tear, cut, or change in any way the gray foam block.   Increase activity slowly as tolerated    Complete by:  As directed    Patient may  shower    Complete by:  As directed    Aquacel dressing is water proof    Wash  over it and the whole leg with soap and water at the end of your shower   TED hose    Complete by:  As directed    Use stockings (TED hose) for 2 weeks on both leg(s).  You may remove them at night for sleeping.      Follow-up Information    Lorn Junes, MD Follow up on 08/27/2016.   Specialty:  Orthopedic Surgery Why:  appt time 2:15 Contact information: [place] [address] [phone]        [facility] .   Specialty:  [facility] Why:  Someone from [facility] will contact you to arrange start date and time for therapy. Contact information: [address] [place] [address] [phone]            Signed: Linda Hedges 08/15/2016, 7:26 AM

## 2016-08-15 NOTE — Evaluation (Signed)
Occupational Therapy Evaluation and Discharge Patient Details Name: Nicole [name] MRN: [id number] DOB: Jul 11, 1956 Today's Date: 08/15/2016    History of Present Illness Pt is a 60 y/o female with PMH of NSTEMI, HTN, and L TKA admitted following R TKA.   Clinical Impression   PTA Pt independent in ADL and IADL and mobility. 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 Pt currently Mod I for ADL, and mobility . Reviewed safety and self care strategies information with Pt. Pt familiar from compensatory strategies and safety from L TKA about a year ago. Pt with no further OT needs at this time.     Follow Up Recommendations  No OT follow up;Supervision - Intermittent    Equipment Recommendations  3 in 1 bedside comode    Recommendations for Other Services       Precautions / Restrictions Precautions Precautions: Knee Precaution Comments: pt has good understanding  Restrictions Weight Bearing Restrictions: Yes RLE Weight Bearing: Weight bearing as tolerated      Mobility Bed Mobility Overal bed mobility: Modified Independent Bed Mobility: Supine to Sit              Transfers Overall transfer level: Modified independent Equipment used: Rolling walker (2 wheeled) Transfers: Sit to/from Stand Sit to Stand: Modified independent (Device/Increase time)         General transfer comment: good hand placement for transfers    Balance Overall balance assessment: Needs assistance Sitting-balance support: No upper extremity supported;Feet supported Sitting balance-Leahy Scale: Good     Standing balance support: No upper extremity supported;During functional activity Standing balance-Leahy Scale: Fair                              ADL Overall ADL's : Modified independent                                       General ADL Comments: Able to get fully dressed, perform sink level ADL, simulate shower transfer, perform toilet transfer and peri  care all mod I     Vision     Perception     Praxis      Pertinent Vitals/Pain Pain Assessment: Faces Faces Pain Scale: Hurts a little bit Pain Location: Right thigh and knee Pain Descriptors / Indicators: Sore Pain Intervention(s): Monitored during session;Repositioned     Hand Dominance Right   Extremity/Trunk Assessment Upper Extremity Assessment Upper Extremity Assessment: Overall WFL for tasks assessed   Lower Extremity Assessment Lower Extremity Assessment: RLE deficits/detail RLE Deficits / Details: RLE with decrease strength and ROM at knee   Cervical / Trunk Assessment Cervical / Trunk Assessment: Normal   Communication Communication Communication: No difficulties   Cognition Arousal/Alertness: Awake/alert Behavior During Therapy: WFL for tasks assessed/performed Overall Cognitive Status: Within Functional Limits for tasks assessed                     General Comments       Exercises       Shoulder Instructions      Home Living Family/patient expects to be discharged to:: Private residence Living Arrangements: Children;Parent Available Help at Discharge: Family;Available 24 hours/day Type of Home: House Home Access: Stairs to enter [employer] of Steps: 4-5 Entrance Stairs-Rails: Right Home Layout: Two level;Bed/bath upstairs     Bathroom Shower/Tub: Walk-in shower  Bathroom Toilet: Programmer, systems: Yes How Accessible: Accessible via walker Home Equipment: None          Prior Functioning/Environment Level of Independence: Independent        Comments: Teaches High School Chemistry        OT Problem List: Decreased range of motion;Decreased activity tolerance;Decreased strength;Impaired balance (sitting and/or standing);Obesity   OT Treatment/Interventions:      OT Goals(Current goals can be found in the care plan section) Acute Rehab OT Goals Patient Stated Goal: To return home OT Goal  Formulation: With patient Time For Goal Achievement: 08/22/16 Potential to Achieve Goals: Good  OT Frequency:     Barriers to D/C:            Co-evaluation              End of Session Equipment Utilized During Treatment: Rolling walker CPM Right Knee CPM Right Knee: Off Right Knee Flexion (Degrees): 90 Right Knee Extension (Degrees): 0 Nurse Communication: Mobility status  Activity Tolerance: Patient tolerated treatment well Patient left: in chair;with call bell/phone within reach;Other (comment) (with ortho tech in room)   Time: [id number] OT Time Calculation (min): 22 min Charges:  OT General Charges $OT Visit: 1 Procedure OT Evaluation $OT Eval Low Complexity: 1 Procedure G-Codes:    Merri Ray Zakkary Thibault 2016/08/27, 9:00 AM Hulda Humphrey OTR/L [phone]

## 2016-08-15 NOTE — Progress Notes (Signed)
Physical Therapy Treatment Patient Details Name: Nicole [name] MRN: [id number] DOB: Mar 13, 1956 Today's Date: 08/15/2016    History of Present Illness Pt is a 60 y/o female with PMH of NSTEMI, HTN, and L TKA admitted following R TKA.    PT Comments    Current plan remains appropriate.   Follow Up Recommendations  Home health PT;Supervision - Intermittent     Equipment Recommendations  Rolling walker with 5" wheels;3in1 (PT)    Recommendations for Other Services       Precautions / Restrictions Precautions Precautions: Knee Precaution Comments: pt has good understanding  Restrictions Weight Bearing Restrictions: Yes RLE Weight Bearing: Weight bearing as tolerated    Mobility  Bed Mobility Overal bed mobility: Independent Bed Mobility: Supine to Sit;Sit to Supine              Transfers Overall transfer level: Modified independent Equipment used: Rolling walker (2 wheeled) Transfers: Sit to/from Stand           General transfer comment: safe hand placement and technique  Ambulation/Gait Ambulation/Gait assistance: Supervision Ambulation Distance (Feet): 200 Feet Assistive device: Rolling walker (2 wheeled) Gait Pattern/deviations: Step-through pattern Gait velocity: decreased   General Gait Details: cues for increased R knee flexion during swing phase and follow through with heel strike; pt tended to maintain R knee extension more this session due to c/o pain   Stairs Stairs: Yes Stairs assistance: Min guard Stair Management: No rails;Backwards;With walker Number of Stairs: 5 General stair comments: cues for positioning of RW; carry over of sequencing and technique demonstrated  Wheelchair Mobility    Modified Rankin (Stroke Patients Only)       Balance     Sitting balance-Leahy Scale: Good       Standing balance-Leahy Scale: Fair                      Cognition Arousal/Alertness: Awake/alert Behavior During Therapy: WFL for  tasks assessed/performed Overall Cognitive Status: Within Functional Limits for tasks assessed                      Exercises Total Joint Exercises Quad Sets: AROM;Right;15 reps;Supine Heel Slides: AROM;Right;15 reps;Supine Hip ABduction/ADduction: AROM;Right;15 reps;Supine Straight Leg Raises: AROM;Right;15 reps;Supine Long Arc Quad: AROM;Right;15 reps;Seated Knee Flexion: AROM;Right;10 reps;Seated;Other (comment) (10 sec holds) Goniometric ROM: 5-85    General Comments        Pertinent Vitals/Pain Pain Assessment: Faces Pain Score: 5  Faces Pain Scale: Hurts little more Pain Location: R thigh and knee Pain Descriptors / Indicators: Guarding;Sore Pain Intervention(s): Limited activity within patient's tolerance;Monitored during session;Premedicated before session;Repositioned    Home Living                      Prior Function            PT Goals (current goals can now be found in the care plan section) Acute Rehab PT Goals Patient Stated Goal: go home PT Goal Formulation: With patient Time For Goal Achievement: 08/20/16 Potential to Achieve Goals: Good Progress towards PT goals: Progressing toward goals    Frequency    7X/week      PT Plan Current plan remains appropriate    Co-evaluation             End of Session Equipment Utilized During Treatment: Gait belt Activity Tolerance: Patient tolerated treatment well Patient left: with call bell/phone within reach;in bed     Time:  [id number] PT Time Calculation (min) (ACUTE ONLY): 15 min  Charges:  $Gait Training: 8-22 mins                     G Codes:      [place] April, PTA Pager: [phone]   08/15/2016, 1:30 PM

## 2017-05-01 ENCOUNTER — Other Ambulatory Visit: Payer: Self-pay | Admitting: Internal Medicine

## 2017-05-01 DIAGNOSIS — Z1231 Encounter for screening mammogram for malignant neoplasm of breast: Secondary | ICD-10-CM

## 2017-05-08 ENCOUNTER — Ambulatory Visit: Payer: BC Managed Care – PPO

## 2017-08-12 ENCOUNTER — Encounter (HOSPITAL_COMMUNITY): Payer: Self-pay

## 2018-06-27 ENCOUNTER — Ambulatory Visit
Admission: RE | Admit: 2018-06-27 | Discharge: 2018-06-27 | Disposition: A | Discharge status: Home / Self Care | Payer: BC Managed Care – PPO | Source: Ambulatory Visit | Attending: Internal Medicine | Admitting: Internal Medicine

## 2018-06-27 DIAGNOSIS — Z1231 Encounter for screening mammogram for malignant neoplasm of breast: Secondary | ICD-10-CM

## 2018-08-11 ENCOUNTER — Encounter (HOSPITAL_COMMUNITY): Payer: Self-pay

## 2018-09-17 ENCOUNTER — Encounter (HOSPITAL_BASED_OUTPATIENT_CLINIC_OR_DEPARTMENT_OTHER): Payer: BC Managed Care – PPO | Attending: Physician Assistant

## 2018-09-17 DIAGNOSIS — G9009 Other idiopathic peripheral autonomic neuropathy: Secondary | ICD-10-CM | POA: Diagnosis not present

## 2018-09-17 DIAGNOSIS — L97526 Non-pressure chronic ulcer of other part of left foot with bone involvement without evidence of necrosis: Secondary | ICD-10-CM | POA: Diagnosis not present

## 2018-09-23 ENCOUNTER — Other Ambulatory Visit: Payer: Self-pay | Admitting: Physician Assistant

## 2018-09-23 ENCOUNTER — Ambulatory Visit (HOSPITAL_COMMUNITY)
Admission: RE | Admit: 2018-09-23 | Discharge: 2018-09-23 | Disposition: A | Discharge status: Home / Self Care | Payer: BC Managed Care – PPO | Source: Ambulatory Visit | Attending: Physician Assistant | Admitting: Physician Assistant

## 2018-09-23 DIAGNOSIS — L97522 Non-pressure chronic ulcer of other part of left foot with fat layer exposed: Secondary | ICD-10-CM

## 2018-09-24 DIAGNOSIS — L97526 Non-pressure chronic ulcer of other part of left foot with bone involvement without evidence of necrosis: Secondary | ICD-10-CM | POA: Diagnosis not present

## 2020-07-05 ENCOUNTER — Other Ambulatory Visit: Payer: Self-pay | Admitting: Oncology

## 2020-07-05 ENCOUNTER — Telehealth: Payer: Self-pay | Admitting: Oncology

## 2020-07-05 DIAGNOSIS — U071 COVID-19: Secondary | ICD-10-CM

## 2020-07-05 NOTE — Telephone Encounter (Signed)
I connected by phone with  Mrs. Gashi to discuss the potential use of an new treatment for mild to moderate COVID-19 viral infection in non-hospitalized patients.   This patient is a age/sex that meets the FDA criteria for Emergency Use Authorization of casirivimab\imdevimab.  Has a (+) direct SARS-CoV-2 viral test result 1. Has mild or moderate COVID-19  2. Is ? 62 years of age and weighs ? 40 kg 3. Is NOT hospitalized due to COVID-19 4. Is NOT requiring oxygen therapy or requiring an increase in baseline oxygen flow rate due to COVID-19 5. Is within 10 days of symptom onset 6. Has at least one of the high risk factor(s) for progression to severe COVID-19 and/or hospitalization as defined in EUA. Specific high risk criteria : Past Medical History:  Diagnosis Date  . Allergy   . Anemia    iron deficiency  . Anemia, chronic disease 08/13/2016  . Anxiety   . Arthritis    hands, right knee, shoulders  . Back pain 07/25/2015   lower back pain  . Breast mass, left    benign fibroadenoma  . Cardiomegaly    on CXR  . Carpal tunnel syndrome    bilateral feet pain and numbness  . Cataract    surgery scheduled for right eye on 04/03/16  . Cellulitis and abscess of leg 11/2010   resolved - right leg  . Complication of anesthesia    blood pressure dropped after surgery  . Depression   . DJD (degenerative joint disease)   . GERD (gastroesophageal reflux disease)   . Hyperlipidemia   . Hypertension   . Iron deficiency anemia   . Myocardial infarction ([facility]) 10/2014   non-ST elevated, no cause found, pt states may have just been a spasm  . Obesity   . PONV (postoperative nausea and vomiting)    with just 1 surgery  . Postoperative anemia due to acute blood loss 08/15/2016  . Presence of retained hardware Broken screw from old [place] patella reallignment 07/31/2016  . Primary localized osteoarthritis of left knee   . Primary localized osteoarthritis of right knee   . 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  ?    Symptom onset  07/01/2020   I have spoken and communicated the following to the patient or parent/caregiver:   1. FDA has authorized the emergency use of casirivimab\imdevimab for the treatment of mild to moderate COVID-19 in adults and pediatric patients with positive results of direct SARS-CoV-2 viral testing who are 46 years of age and older weighing at least 40 kg, and who are at high risk for progressing to severe COVID-19 and/or hospitalization.   2. The significant known and potential risks and benefits of casirivimab\imdevimab, and the extent to which such potential risks and benefits are unknown.   3. Information on available alternative treatments and the risks and benefits of those alternatives, including clinical trials.   4. Patients treated with casirivimab\imdevimab should continue to self-isolate and use infection control measures (e.g., wear mask, isolate, social distance, avoid sharing personal items, clean and disinfect "high touch" surfaces, and frequent handwashing) according to CDC guidelines.    5. The patient or parent/caregiver has the option to accept or refuse casirivimab\imdevimab .   After reviewing this information with the patient, The patient agreed to proceed with receiving casirivimab\imdevimab infusion and will be provided a copy of the Fact sheet prior to receiving the infusion.Rulon Abide, AGNP-C [phone] ([place])

## 2020-07-06 ENCOUNTER — Ambulatory Visit (HOSPITAL_COMMUNITY)
Admission: RE | Admit: 2020-07-06 | Discharge: 2020-07-06 | Disposition: A | Discharge status: Home / Self Care | Payer: BC Managed Care – PPO | Source: Ambulatory Visit | Attending: Pulmonary Disease | Admitting: Pulmonary Disease

## 2020-07-06 DIAGNOSIS — U071 COVID-19: Secondary | ICD-10-CM | POA: Insufficient documentation

## 2020-07-06 MED ORDER — SODIUM CHLORIDE 0.9 % IV SOLN
1200.0000 mg | Freq: Once | INTRAVENOUS | Status: AC
  Administered 2020-07-06: 1200 mg via INTRAVENOUS

## 2020-07-06 MED ORDER — EPINEPHRINE 0.3 MG/0.3ML IJ SOAJ: 0.3000 mg | Freq: Once | INTRAMUSCULAR | Status: DC | PRN

## 2020-07-06 MED ORDER — METHYLPREDNISOLONE SODIUM SUCC 125 MG IJ SOLR: 125.0000 mg | Freq: Once | INTRAMUSCULAR | Status: DC | PRN

## 2020-07-06 MED ORDER — DIPHENHYDRAMINE HCL 50 MG/ML IJ SOLN: 50.0000 mg | Freq: Once | INTRAMUSCULAR | Status: DC | PRN

## 2020-07-06 MED ORDER — FAMOTIDINE IN NACL 20-0.9 MG/50ML-% IV SOLN: 20.0000 mg | Freq: Once | INTRAVENOUS | Status: DC | PRN

## 2020-07-06 MED ORDER — ALBUTEROL SULFATE HFA 108 (90 BASE) MCG/ACT IN AERS: 2.0000 | INHALATION_SPRAY | Freq: Once | RESPIRATORY_TRACT | Status: DC | PRN

## 2020-07-06 MED ORDER — SODIUM CHLORIDE 0.9 % IV SOLN: INTRAVENOUS | Status: DC | PRN

## 2020-07-06 NOTE — Progress Notes (Signed)
  Diagnosis: COVID-19  Physician: Dr. Joya Gaskins  Procedure: [facility] Med: casirivimab\imdevimab infusion - Provided patient with casirivimab\imdevimab fact sheet for patients, parents and caregivers prior to infusion.  Complications: No immediate complications noted.  Discharge: Discharged home   [place] 07/06/2020

## 2020-07-06 NOTE — Discharge Instructions (Signed)

## 2020-09-01 ENCOUNTER — Encounter: Payer: Self-pay | Admitting: Internal Medicine

## 2020-11-01 ENCOUNTER — Encounter: Payer: BC Managed Care – PPO | Admitting: Internal Medicine

## 2020-11-02 ENCOUNTER — Other Ambulatory Visit: Payer: Self-pay

## 2020-11-02 ENCOUNTER — Ambulatory Visit (AMBULATORY_SURGERY_CENTER): Payer: Self-pay

## 2020-11-02 VITALS — Ht 67.0 in | Wt 240.0 lb

## 2020-11-02 DIAGNOSIS — Z1211 Encounter for screening for malignant neoplasm of colon: Secondary | ICD-10-CM

## 2020-11-02 NOTE — Progress Notes (Signed)
Pt verified name, DOB, address and insurance during PV today.   Pt mailed instruction packet to included paper to complete and mail back to [facility] with addressed and stamped envelope, Emmi video, copy of consent form to read and not return, and instructions mailed in packet. PV completed over the phone. Pt encouraged to call with questions or issues   No allergies to soy or egg Pt is not on blood thinners or diet pills Denies issues with sedation/intubation Denies atrial flutter/fib Denies constipation   Pt is aware of Covid safety and care partner requirements.   States is not taking any blood thinner--Levonox stopped in 2017.   Wt self report- 240 lbs last month   Takes Miralax a bout  Once every 3-4 months

## 2020-11-09 ENCOUNTER — Encounter: Payer: Self-pay | Admitting: Internal Medicine

## 2020-11-16 ENCOUNTER — Encounter: Payer: Self-pay | Admitting: Internal Medicine

## 2020-11-16 ENCOUNTER — Other Ambulatory Visit: Payer: Self-pay

## 2020-11-16 ENCOUNTER — Ambulatory Visit (AMBULATORY_SURGERY_CENTER): Payer: BC Managed Care – PPO | Admitting: Internal Medicine

## 2020-11-16 VITALS — BP 124/67 | HR 64 | Temp 97.5°F | Resp 17 | Ht 67.0 in | Wt 240.0 lb

## 2020-11-16 DIAGNOSIS — D120 Benign neoplasm of cecum: Secondary | ICD-10-CM

## 2020-11-16 DIAGNOSIS — D122 Benign neoplasm of ascending colon: Secondary | ICD-10-CM | POA: Diagnosis not present

## 2020-11-16 DIAGNOSIS — Z1211 Encounter for screening for malignant neoplasm of colon: Secondary | ICD-10-CM | POA: Diagnosis not present

## 2020-11-16 DIAGNOSIS — D123 Benign neoplasm of transverse colon: Secondary | ICD-10-CM

## 2020-11-16 MED ORDER — SODIUM CHLORIDE 0.9 % IV SOLN: 500.0000 mL | Freq: Once | INTRAVENOUS | Status: DC

## 2020-11-16 NOTE — Progress Notes (Signed)
Pt's states no medical or surgical changes since previsit or office visit.  KW IV.

## 2020-11-16 NOTE — Op Note (Signed)
[place] Patient Name: Nicole [name] Procedure Date: 11/16/2020 1:25 PM MRN: [id number] Endoscopist: Gatha Mayer , MD Age: 64 Referring MD:  Date of Birth: [birth year] Gender: Female Account #: [id number] Procedure:                Colonoscopy Indications:              Screening for colorectal malignant neoplasm, This                            is the patient's first colonoscopy Medicines:                Propofol per Anesthesia, Monitored Anesthesia Care Procedure:                Pre-Anesthesia Assessment:                           - Prior to the procedure, a History and Physical                            was performed, and patient medications and                            allergies were reviewed. The patient's tolerance of                            previous anesthesia was also reviewed. The risks                            and benefits of the procedure and the sedation                            options and risks were discussed with the patient.                            All questions were answered, and informed consent                            was obtained. Prior Anticoagulants: The patient has                            taken no previous anticoagulant or antiplatelet                            agents. ASA Grade Assessment: III - A patient with                            severe systemic disease. After reviewing the risks                            and benefits, the patient was deemed in                            satisfactory condition to undergo the procedure.  After obtaining informed consent, the colonoscope                            was passed under direct vision. Throughout the                            procedure, the patient's blood pressure, pulse, and                            oxygen saturations were monitored continuously. The                            Olympus [id number]) Colonoscope was                             introduced through the anus and advanced to the the                            cecum, identified by appendiceal orifice and                            ileocecal valve. The colonoscopy was somewhat                            difficult due to significant looping. Successful                            completion of the procedure was aided by applying                            abdominal pressure. The patient tolerated the                            procedure well. The quality of the bowel                            preparation was good. The bowel preparation used                            was Miralax via split dose instruction. The                            ileocecal valve, appendiceal orifice, and rectum                            were photographed. Scope In: 1:37:58 PM Scope Out: 2:00:29 PM Scope Withdrawal Time: 0 hours 15 minutes 42 seconds  Total Procedure Duration: 0 hours 22 minutes 31 seconds  Findings:                 The perianal and digital rectal examinations were                            normal.  Six sessile polyps were found in the transverse                            colon, ascending colon and cecum. The polyps were                            diminutive in size. These polyps were removed with                            a cold snare. Resection and retrieval were                            complete. Verification of patient identification                            for the specimen was done. Estimated blood loss was                            minimal.                           Many small and large-mouthed diverticula were found                            in the sigmoid colon and descending colon.                           The exam was otherwise without abnormality on                            direct and retroflexion views. Complications:            No immediate complications. Estimated Blood Loss:     Estimated blood loss was minimal. Impression:                - Six diminutive polyps in the transverse colon, in                            the ascending colon and in the cecum, removed with                            a cold snare. Resected and retrieved.                           - Diverticulosis in the sigmoid colon and in the                            descending colon.                           - The examination was otherwise normal on direct                            and retroflexion views. Recommendation:           -  Patient has a contact number available for                            emergencies. The signs and symptoms of potential                            delayed complications were discussed with the                            patient. Return to normal activities tomorrow.                            Written discharge instructions were provided to the                            patient.                           - Resume previous diet.                           - Continue present medications.                           - Repeat colonoscopy is recommended. The                            colonoscopy date will be determined after pathology                            results from today's exam become available for                            review. Gatha Mayer, MD 11/16/2020 2:13:27 PM This report has been signed electronically.

## 2020-11-16 NOTE — Progress Notes (Signed)
Called to room to assist during endoscopic procedure.  Patient ID and intended procedure confirmed with present staff. Received instructions for my participation in the procedure from the performing physician.  

## 2020-11-16 NOTE — Progress Notes (Signed)
Report given to PACU, vss 

## 2020-11-16 NOTE — Patient Instructions (Addendum)
I found and removed 6 tiny polyps that look benign but at least some will be pre-cancerous.  You also have a condition called diverticulosis - common and not usually a problem. Please read the handout provided.  I will let you know pathology results and when to have another routine colonoscopy by mail and/or My Chart.  I appreciate the opportunity to care for you. Gatha Mayer, MD, [facility]  Discharge instructions given. Handouts on polyps and diverticulosis. Resume previous medications. YOU HAD AN ENDOSCOPIC PROCEDURE TODAY AT [facility]:   Refer to the procedure report that was given to you for any specific questions about what was found during the examination.  If the procedure report does not answer your questions, please call your gastroenterologist to clarify.  If you requested that your care partner not be given the details of your procedure findings, then the procedure report has been included in a sealed envelope for you to review at your convenience later.  YOU SHOULD EXPECT: Some feelings of bloating in the abdomen. Passage of more gas than usual.  Walking can help get rid of the air that was put into your GI tract during the procedure and reduce the bloating. If you had a lower endoscopy (such as a colonoscopy or flexible sigmoidoscopy) you may notice spotting of blood in your stool or on the toilet paper. If you underwent a bowel prep for your procedure, you may not have a normal bowel movement for a few days.  Please Note:  You might notice some irritation and congestion in your nose or some drainage.  This is from the oxygen used during your procedure.  There is no need for concern and it should clear up in a day or so.  SYMPTOMS TO REPORT IMMEDIATELY:   Following lower endoscopy (colonoscopy or flexible sigmoidoscopy):  Excessive amounts of blood in the stool  Significant tenderness or worsening of abdominal pains  Swelling of the abdomen that is new,  acute  Fever of 100F or higher  For urgent or emergent issues, a gastroenterologist can be reached at any hour by calling [phone]. Do not use MyChart messaging for urgent concerns.    DIET:  We do recommend a small meal at first, but then you may proceed to your regular diet.  Drink plenty of fluids but you should avoid alcoholic beverages for 24 hours.  ACTIVITY:  You should plan to take it easy for the rest of today and you should NOT DRIVE or use heavy machinery until tomorrow (because of the sedation medicines used during the test).    FOLLOW UP: Our staff will call the number listed on your records 48-72 hours following your procedure to check on you and address any questions or concerns that you may have regarding the information given to you following your procedure. If we do not reach you, we will leave a message.  We will attempt to reach you two times.  During this call, we will ask if you have developed any symptoms of COVID 19. If you develop any symptoms (ie: fever, flu-like symptoms, shortness of breath, cough etc.) before then, please call [phone].  If you test positive for Covid 19 in the 2 weeks post procedure, please call and report this information to [place].    If any biopsies were taken you will be contacted by phone or by letter within the next 1-3 weeks.  Please call us at [phone] if you have not heard about the  biopsies in 3 weeks.    SIGNATURES/CONFIDENTIALITY: You and/or your care partner have signed paperwork which will be entered into your electronic medical record.  These signatures attest to the fact that that the information above on your After Visit Summary has been reviewed and is understood.  Full responsibility of the confidentiality of this discharge information lies with you and/or your care-partner.

## 2020-11-18 ENCOUNTER — Telehealth: Payer: Self-pay

## 2020-11-18 NOTE — Telephone Encounter (Signed)
  Follow up Call-  Call back number 11/16/2020  Post procedure Call Back phone  # [phone]  Permission to leave phone message Yes  Some recent data might be hidden     Patient questions:  Do you have a fever, pain , or abdominal swelling? No. Pain Score  0 *  Have you tolerated food without any problems? Yes.    Have you been able to return to your normal activities? Yes.    Do you have any questions about your discharge instructions: Diet   No. Medications  No. Follow up visit  No.  Do you have questions or concerns about your Care? No.  Actions: * If pain score is 4 or above: No action needed, pain <4.  1. Have you developed a fever since your procedure? No  2.   Have you had an respiratory symptoms (SOB or cough) since your procedure? No 3.   Have you tested positive for COVID 19 since your procedure No 4.   Have you had any family members/close contacts diagnosed with the COVID 19 since your procedure?  No  If yes to any of these questions please route to Joylene John, RN and Joella Prince, RN

## 2020-11-24 ENCOUNTER — Encounter: Payer: Self-pay | Admitting: Internal Medicine

## 2021-08-25 ENCOUNTER — Other Ambulatory Visit: Payer: Self-pay | Admitting: Internal Medicine

## 2021-08-25 DIAGNOSIS — E782 Mixed hyperlipidemia: Secondary | ICD-10-CM

## 2021-08-30 ENCOUNTER — Other Ambulatory Visit: Payer: Self-pay | Admitting: Internal Medicine

## 2021-08-30 DIAGNOSIS — Z1231 Encounter for screening mammogram for malignant neoplasm of breast: Secondary | ICD-10-CM

## 2021-09-01 ENCOUNTER — Other Ambulatory Visit (HOSPITAL_COMMUNITY): Payer: Self-pay | Admitting: Internal Medicine

## 2021-09-01 DIAGNOSIS — R011 Cardiac murmur, unspecified: Secondary | ICD-10-CM

## 2021-09-02 ENCOUNTER — Other Ambulatory Visit: Payer: Self-pay

## 2021-09-02 ENCOUNTER — Ambulatory Visit
Admission: RE | Admit: 2021-09-02 | Discharge: 2021-09-02 | Disposition: A | Discharge status: Home / Self Care | Payer: BC Managed Care – PPO | Source: Ambulatory Visit | Attending: Internal Medicine | Admitting: Internal Medicine

## 2021-09-02 DIAGNOSIS — Z1231 Encounter for screening mammogram for malignant neoplasm of breast: Secondary | ICD-10-CM

## 2021-10-04 ENCOUNTER — Ambulatory Visit (HOSPITAL_COMMUNITY): Payer: BC Managed Care – PPO | Attending: Cardiology

## 2021-10-04 ENCOUNTER — Encounter (HOSPITAL_COMMUNITY): Payer: Self-pay | Admitting: Internal Medicine

## 2021-10-11 ENCOUNTER — Ambulatory Visit
Admission: RE | Admit: 2021-10-11 | Discharge: 2021-10-11 | Disposition: A | Discharge status: Home / Self Care | Payer: No Typology Code available for payment source | Source: Ambulatory Visit | Attending: Internal Medicine | Admitting: Internal Medicine

## 2021-10-11 ENCOUNTER — Other Ambulatory Visit: Payer: Self-pay

## 2021-10-11 ENCOUNTER — Ambulatory Visit (HOSPITAL_COMMUNITY): Payer: BC Managed Care – PPO | Attending: Cardiology

## 2021-10-11 DIAGNOSIS — R011 Cardiac murmur, unspecified: Secondary | ICD-10-CM | POA: Diagnosis present

## 2021-10-11 DIAGNOSIS — E782 Mixed hyperlipidemia: Secondary | ICD-10-CM

## 2021-10-11 LAB — ECHOCARDIOGRAM COMPLETE
Area-P 1/2: 3.91 cm2
P 1/2 time: 559 msec
S' Lateral: 3.1 cm

## 2023-10-01 ENCOUNTER — Other Ambulatory Visit: Payer: Self-pay | Admitting: Internal Medicine

## 2023-10-01 DIAGNOSIS — Z1231 Encounter for screening mammogram for malignant neoplasm of breast: Secondary | ICD-10-CM

## 2023-10-21 DIAGNOSIS — H1131 Conjunctival hemorrhage, right eye: Secondary | ICD-10-CM | POA: Diagnosis not present

## 2023-10-21 DIAGNOSIS — H524 Presbyopia: Secondary | ICD-10-CM | POA: Diagnosis not present

## 2023-11-01 ENCOUNTER — Ambulatory Visit: Payer: No Typology Code available for payment source

## 2023-11-04 ENCOUNTER — Encounter: Payer: Self-pay | Admitting: Internal Medicine

## 2023-11-14 ENCOUNTER — Ambulatory Visit: Payer: No Typology Code available for payment source

## 2023-11-28 ENCOUNTER — Ambulatory Visit: Payer: No Typology Code available for payment source

## 2024-03-05 DIAGNOSIS — M25512 Pain in left shoulder: Secondary | ICD-10-CM | POA: Diagnosis not present

## 2024-03-20 DIAGNOSIS — M75112 Incomplete rotator cuff tear or rupture of left shoulder, not specified as traumatic: Secondary | ICD-10-CM | POA: Diagnosis not present

## 2024-03-20 DIAGNOSIS — M19012 Primary osteoarthritis, left shoulder: Secondary | ICD-10-CM | POA: Diagnosis not present

## 2024-03-20 DIAGNOSIS — M67814 Other specified disorders of tendon, left shoulder: Secondary | ICD-10-CM | POA: Diagnosis not present

## 2024-03-20 DIAGNOSIS — M66812 Spontaneous rupture of other tendons, left shoulder: Secondary | ICD-10-CM | POA: Diagnosis not present

## 2024-03-20 DIAGNOSIS — M25512 Pain in left shoulder: Secondary | ICD-10-CM | POA: Diagnosis not present

## 2024-03-30 DIAGNOSIS — I213 ST elevation (STEMI) myocardial infarction of unspecified site: Secondary | ICD-10-CM | POA: Diagnosis not present

## 2024-03-30 DIAGNOSIS — I10 Essential (primary) hypertension: Secondary | ICD-10-CM | POA: Diagnosis not present

## 2024-03-30 DIAGNOSIS — R7301 Impaired fasting glucose: Secondary | ICD-10-CM | POA: Diagnosis not present

## 2024-03-30 DIAGNOSIS — J302 Other seasonal allergic rhinitis: Secondary | ICD-10-CM | POA: Diagnosis not present

## 2024-03-30 DIAGNOSIS — M25519 Pain in unspecified shoulder: Secondary | ICD-10-CM | POA: Diagnosis not present

## 2024-03-30 DIAGNOSIS — F329 Major depressive disorder, single episode, unspecified: Secondary | ICD-10-CM | POA: Diagnosis not present

## 2024-03-30 DIAGNOSIS — E669 Obesity, unspecified: Secondary | ICD-10-CM | POA: Diagnosis not present

## 2024-03-30 DIAGNOSIS — M1990 Unspecified osteoarthritis, unspecified site: Secondary | ICD-10-CM | POA: Diagnosis not present

## 2024-03-30 DIAGNOSIS — I351 Nonrheumatic aortic (valve) insufficiency: Secondary | ICD-10-CM | POA: Diagnosis not present

## 2024-03-30 DIAGNOSIS — Z9884 Bariatric surgery status: Secondary | ICD-10-CM | POA: Diagnosis not present

## 2024-03-30 DIAGNOSIS — K219 Gastro-esophageal reflux disease without esophagitis: Secondary | ICD-10-CM | POA: Diagnosis not present

## 2024-03-30 DIAGNOSIS — E782 Mixed hyperlipidemia: Secondary | ICD-10-CM | POA: Diagnosis not present

## 2024-04-07 DIAGNOSIS — M75112 Incomplete rotator cuff tear or rupture of left shoulder, not specified as traumatic: Secondary | ICD-10-CM | POA: Diagnosis not present

## 2024-05-14 ENCOUNTER — Other Ambulatory Visit (HOSPITAL_COMMUNITY): Payer: Self-pay | Admitting: Internal Medicine

## 2024-05-14 DIAGNOSIS — I351 Nonrheumatic aortic (valve) insufficiency: Secondary | ICD-10-CM

## 2024-06-03 ENCOUNTER — Ambulatory Visit (HOSPITAL_BASED_OUTPATIENT_CLINIC_OR_DEPARTMENT_OTHER)
Admission: RE | Admit: 2024-06-03 | Discharge: 2024-06-03 | Disposition: A | Discharge status: Home / Self Care | Payer: Self-pay | Source: Ambulatory Visit | Attending: Internal Medicine | Admitting: Internal Medicine

## 2024-06-03 DIAGNOSIS — I351 Nonrheumatic aortic (valve) insufficiency: Secondary | ICD-10-CM

## 2024-06-03 DIAGNOSIS — M75112 Incomplete rotator cuff tear or rupture of left shoulder, not specified as traumatic: Secondary | ICD-10-CM | POA: Diagnosis not present

## 2024-06-03 LAB — ECHOCARDIOGRAM COMPLETE
AR max vel: 1.35 cm2
AV Area VTI: 1.59 cm2
AV Area mean vel: 1.41 cm2
AV Mean grad: 11.5 mmHg
AV Peak grad: 24.6 mmHg
AV Vena cont: 0.4 cm
Ao pk vel: 2.48 m/s
Area-P 1/2: 3.54 cm2
Calc EF: 68.9 %
MV M vel: 4.56 m/s
MV Peak grad: 83.2 mmHg
S' Lateral: 2.9 cm
Single Plane A2C EF: 64.5 %
Single Plane A4C EF: 71.6 %

## 2024-06-04 DIAGNOSIS — M75112 Incomplete rotator cuff tear or rupture of left shoulder, not specified as traumatic: Secondary | ICD-10-CM | POA: Diagnosis not present

## 2024-06-04 DIAGNOSIS — S46212A Strain of muscle, fascia and tendon of other parts of biceps, left arm, initial encounter: Secondary | ICD-10-CM | POA: Diagnosis not present

## 2024-06-04 DIAGNOSIS — M25512 Pain in left shoulder: Secondary | ICD-10-CM | POA: Diagnosis not present

## 2024-06-04 DIAGNOSIS — M7582 Other shoulder lesions, left shoulder: Secondary | ICD-10-CM | POA: Diagnosis not present

## 2024-06-04 DIAGNOSIS — G8918 Other acute postprocedural pain: Secondary | ICD-10-CM | POA: Diagnosis not present

## 2024-06-04 DIAGNOSIS — M24112 Other articular cartilage disorders, left shoulder: Secondary | ICD-10-CM | POA: Diagnosis not present

## 2024-06-04 DIAGNOSIS — M7542 Impingement syndrome of left shoulder: Secondary | ICD-10-CM | POA: Diagnosis not present

## 2024-06-04 DIAGNOSIS — M94212 Chondromalacia, left shoulder: Secondary | ICD-10-CM | POA: Diagnosis not present

## 2024-06-08 DIAGNOSIS — M25512 Pain in left shoulder: Secondary | ICD-10-CM | POA: Diagnosis not present

## 2024-06-08 DIAGNOSIS — M75112 Incomplete rotator cuff tear or rupture of left shoulder, not specified as traumatic: Secondary | ICD-10-CM | POA: Diagnosis not present

## 2024-06-11 DIAGNOSIS — M75112 Incomplete rotator cuff tear or rupture of left shoulder, not specified as traumatic: Secondary | ICD-10-CM | POA: Diagnosis not present

## 2024-06-11 DIAGNOSIS — M25512 Pain in left shoulder: Secondary | ICD-10-CM | POA: Diagnosis not present

## 2024-06-16 DIAGNOSIS — M25512 Pain in left shoulder: Secondary | ICD-10-CM | POA: Diagnosis not present

## 2024-06-16 DIAGNOSIS — M75112 Incomplete rotator cuff tear or rupture of left shoulder, not specified as traumatic: Secondary | ICD-10-CM | POA: Diagnosis not present

## 2024-06-22 DIAGNOSIS — M25512 Pain in left shoulder: Secondary | ICD-10-CM | POA: Diagnosis not present

## 2024-06-22 DIAGNOSIS — M75112 Incomplete rotator cuff tear or rupture of left shoulder, not specified as traumatic: Secondary | ICD-10-CM | POA: Diagnosis not present

## 2024-06-26 DIAGNOSIS — M75120 Complete rotator cuff tear or rupture of unspecified shoulder, not specified as traumatic: Secondary | ICD-10-CM | POA: Diagnosis not present

## 2024-06-29 DIAGNOSIS — M25512 Pain in left shoulder: Secondary | ICD-10-CM | POA: Diagnosis not present

## 2024-06-29 DIAGNOSIS — M75112 Incomplete rotator cuff tear or rupture of left shoulder, not specified as traumatic: Secondary | ICD-10-CM | POA: Diagnosis not present

## 2024-07-02 DIAGNOSIS — M75112 Incomplete rotator cuff tear or rupture of left shoulder, not specified as traumatic: Secondary | ICD-10-CM | POA: Diagnosis not present

## 2024-07-02 DIAGNOSIS — M25512 Pain in left shoulder: Secondary | ICD-10-CM | POA: Diagnosis not present

## 2024-07-06 DIAGNOSIS — M25512 Pain in left shoulder: Secondary | ICD-10-CM | POA: Diagnosis not present

## 2024-07-06 DIAGNOSIS — M75112 Incomplete rotator cuff tear or rupture of left shoulder, not specified as traumatic: Secondary | ICD-10-CM | POA: Diagnosis not present

## 2024-07-10 DIAGNOSIS — M75120 Complete rotator cuff tear or rupture of unspecified shoulder, not specified as traumatic: Secondary | ICD-10-CM | POA: Diagnosis not present

## 2024-07-16 DIAGNOSIS — M25512 Pain in left shoulder: Secondary | ICD-10-CM | POA: Diagnosis not present

## 2024-07-16 DIAGNOSIS — M75112 Incomplete rotator cuff tear or rupture of left shoulder, not specified as traumatic: Secondary | ICD-10-CM | POA: Diagnosis not present

## 2024-07-17 DIAGNOSIS — M75120 Complete rotator cuff tear or rupture of unspecified shoulder, not specified as traumatic: Secondary | ICD-10-CM | POA: Diagnosis not present

## 2024-07-20 DIAGNOSIS — M25512 Pain in left shoulder: Secondary | ICD-10-CM | POA: Diagnosis not present

## 2024-07-20 DIAGNOSIS — M75112 Incomplete rotator cuff tear or rupture of left shoulder, not specified as traumatic: Secondary | ICD-10-CM | POA: Diagnosis not present

## 2024-07-23 DIAGNOSIS — M25512 Pain in left shoulder: Secondary | ICD-10-CM | POA: Diagnosis not present

## 2024-07-23 DIAGNOSIS — M75112 Incomplete rotator cuff tear or rupture of left shoulder, not specified as traumatic: Secondary | ICD-10-CM | POA: Diagnosis not present

## 2024-07-28 DIAGNOSIS — M25512 Pain in left shoulder: Secondary | ICD-10-CM | POA: Diagnosis not present

## 2024-07-28 DIAGNOSIS — M75112 Incomplete rotator cuff tear or rupture of left shoulder, not specified as traumatic: Secondary | ICD-10-CM | POA: Diagnosis not present

## 2024-07-30 DIAGNOSIS — M75112 Incomplete rotator cuff tear or rupture of left shoulder, not specified as traumatic: Secondary | ICD-10-CM | POA: Diagnosis not present

## 2024-07-30 DIAGNOSIS — M25512 Pain in left shoulder: Secondary | ICD-10-CM | POA: Diagnosis not present

## 2024-08-03 DIAGNOSIS — M25512 Pain in left shoulder: Secondary | ICD-10-CM | POA: Diagnosis not present

## 2024-08-03 DIAGNOSIS — M75112 Incomplete rotator cuff tear or rupture of left shoulder, not specified as traumatic: Secondary | ICD-10-CM | POA: Diagnosis not present

## 2024-08-06 DIAGNOSIS — M25512 Pain in left shoulder: Secondary | ICD-10-CM | POA: Diagnosis not present

## 2024-08-06 DIAGNOSIS — M75112 Incomplete rotator cuff tear or rupture of left shoulder, not specified as traumatic: Secondary | ICD-10-CM | POA: Diagnosis not present

## 2024-10-27 ENCOUNTER — Other Ambulatory Visit: Payer: Self-pay | Admitting: Internal Medicine

## 2024-10-27 DIAGNOSIS — Z1231 Encounter for screening mammogram for malignant neoplasm of breast: Secondary | ICD-10-CM

## 2024-11-23 ENCOUNTER — Ambulatory Visit
